# Patient Record
Sex: Female | Born: 1960 | Race: Black or African American | Hispanic: No | Marital: Married | Smoking: Former smoker
Health system: Southern US, Community
[De-identification: ages and names within clinical notes are randomized; demographics above are authoritative.]

## PROBLEM LIST (undated history)

## (undated) DIAGNOSIS — I10 Essential (primary) hypertension: Secondary | ICD-10-CM

## (undated) DIAGNOSIS — K589 Irritable bowel syndrome without diarrhea: Secondary | ICD-10-CM

## (undated) HISTORY — DX: Irritable bowel syndrome, unspecified: K58.9

---

## 1991-12-29 HISTORY — PX: TUBAL LIGATION: SHX77

## 1993-12-28 HISTORY — PX: ABDOMINAL HYSTERECTOMY: SHX81

## 1998-06-17 ENCOUNTER — Ambulatory Visit (HOSPITAL_COMMUNITY): Admission: RE | Admit: 1998-06-17 | Discharge: 1998-06-17 | Payer: Self-pay

## 1999-08-29 HISTORY — PX: LUMBAR DISC SURGERY: SHX700

## 2000-04-05 ENCOUNTER — Encounter: Payer: Self-pay | Admitting: Internal Medicine

## 2000-04-05 ENCOUNTER — Emergency Department (HOSPITAL_COMMUNITY): Admission: EM | Admit: 2000-04-05 | Discharge: 2000-04-05 | Payer: Self-pay | Admitting: Emergency Medicine

## 2000-04-05 ENCOUNTER — Observation Stay (HOSPITAL_COMMUNITY): Admission: AD | Admit: 2000-04-05 | Discharge: 2000-04-07 | Payer: Self-pay | Admitting: Internal Medicine

## 2000-04-05 ENCOUNTER — Encounter: Payer: Self-pay | Admitting: Emergency Medicine

## 2000-05-20 ENCOUNTER — Ambulatory Visit (HOSPITAL_COMMUNITY): Admission: RE | Admit: 2000-05-20 | Discharge: 2000-05-20 | Payer: Self-pay | Admitting: Psychiatry

## 2001-09-13 ENCOUNTER — Emergency Department (HOSPITAL_COMMUNITY): Admission: EM | Admit: 2001-09-13 | Discharge: 2001-09-13 | Payer: Self-pay

## 2002-03-10 ENCOUNTER — Encounter: Admission: RE | Admit: 2002-03-10 | Discharge: 2002-03-10 | Payer: Self-pay | Admitting: Family Medicine

## 2002-03-10 ENCOUNTER — Encounter: Payer: Self-pay | Admitting: Family Medicine

## 2002-08-31 ENCOUNTER — Inpatient Hospital Stay (HOSPITAL_COMMUNITY): Admission: RE | Admit: 2002-08-31 | Discharge: 2002-09-01 | Payer: Self-pay | Admitting: Neurosurgery

## 2002-08-31 ENCOUNTER — Encounter: Payer: Self-pay | Admitting: Neurosurgery

## 2005-07-29 ENCOUNTER — Encounter: Admission: RE | Admit: 2005-07-29 | Discharge: 2005-07-29 | Payer: Self-pay | Admitting: Gastroenterology

## 2006-07-15 ENCOUNTER — Encounter: Admission: RE | Admit: 2006-07-15 | Discharge: 2006-07-15 | Payer: Self-pay | Admitting: Internal Medicine

## 2006-07-19 ENCOUNTER — Encounter: Admission: RE | Admit: 2006-07-19 | Discharge: 2006-07-19 | Payer: Self-pay | Admitting: Internal Medicine

## 2006-12-15 ENCOUNTER — Encounter: Admission: RE | Admit: 2006-12-15 | Discharge: 2006-12-15 | Payer: Self-pay | Admitting: Internal Medicine

## 2007-11-04 ENCOUNTER — Encounter (INDEPENDENT_AMBULATORY_CARE_PROVIDER_SITE_OTHER): Payer: Self-pay | Admitting: Internal Medicine

## 2007-11-04 ENCOUNTER — Inpatient Hospital Stay (HOSPITAL_COMMUNITY): Admission: EM | Admit: 2007-11-04 | Discharge: 2007-11-07 | Payer: Self-pay | Admitting: Emergency Medicine

## 2007-11-04 ENCOUNTER — Ambulatory Visit: Payer: Self-pay | Admitting: Vascular Surgery

## 2007-11-15 ENCOUNTER — Encounter: Admission: RE | Admit: 2007-11-15 | Discharge: 2007-11-15 | Payer: Self-pay | Admitting: Internal Medicine

## 2007-11-22 ENCOUNTER — Encounter: Admission: RE | Admit: 2007-11-22 | Discharge: 2007-11-22 | Payer: Self-pay | Admitting: Gastroenterology

## 2007-11-29 ENCOUNTER — Encounter: Admission: RE | Admit: 2007-11-29 | Discharge: 2007-11-29 | Payer: Self-pay | Admitting: Internal Medicine

## 2007-12-02 ENCOUNTER — Encounter: Admission: RE | Admit: 2007-12-02 | Discharge: 2007-12-02 | Payer: Self-pay | Admitting: Internal Medicine

## 2008-12-04 ENCOUNTER — Encounter: Admission: RE | Admit: 2008-12-04 | Discharge: 2008-12-04 | Payer: Self-pay | Admitting: Gastroenterology

## 2008-12-12 ENCOUNTER — Encounter: Admission: RE | Admit: 2008-12-12 | Discharge: 2008-12-12 | Payer: Self-pay | Admitting: Gastroenterology

## 2009-04-18 ENCOUNTER — Encounter: Admission: RE | Admit: 2009-04-18 | Discharge: 2009-04-18 | Payer: Self-pay | Admitting: Family Medicine

## 2009-12-05 ENCOUNTER — Encounter: Admission: RE | Admit: 2009-12-05 | Discharge: 2009-12-05 | Payer: Self-pay | Admitting: Family Medicine

## 2010-12-08 ENCOUNTER — Encounter
Admission: RE | Admit: 2010-12-08 | Discharge: 2010-12-08 | Payer: Self-pay | Source: Home / Self Care | Attending: Family Medicine | Admitting: Family Medicine

## 2011-01-18 ENCOUNTER — Encounter: Payer: Self-pay | Admitting: Internal Medicine

## 2011-01-18 ENCOUNTER — Encounter: Payer: Self-pay | Admitting: Gastroenterology

## 2011-05-12 NOTE — H&P (Signed)
Jane Buckley, Jane Buckley            ACCOUNT NO.:  [id number]   MEDICAL RECORD NO.:  [id number]          PATIENT TYPE:  INP   LOCATION:  4733                         FACILITY:  [facility]   PHYSICIAN:  Kela Millin, M.D.DATE OF BIRTH:  1960-12-29   DATE OF ADMISSION:  11/04/2007  DATE OF DISCHARGE:                              HISTORY & PHYSICAL   CHIEF COMPLAINT:  Chest pain and left-sided paresthesias.   HISTORY OF PRESENT ILLNESS:  The patient is a 50 year old black female  with past medical history significant for GERD, status post surgery for  C4-C5 cervical disk herniation with radiculopathy, mitral valve  prolapse, history of gastric ulcers who presents with the above  complaints.  She states that she has had these symptoms for one day.  Ms. Jane Buckley works from home and reports that while she was typing, she  suddenly could not feel her left hand and she thought that maybe it just  fell asleep and so tried to massage it and just do some movements but  that persisted.  Shortly thereafter she also began having numbness in  her left leg and tried to do the same thing to work it out.  She also  then got up and went outside just to try to move around and relax, but  then began having some chest pain so she came back in and sat down and  then dosed off.  When she woke up, she was still having the same chest  pain and intermittently through the night she continued to have chest  pain.  She describes the pain as sharp, associated with palpitations, 7-  1/2 out of 10 in intensity, mid sternal in location.  She admits to  diaphoresis and states that the duration of the pain was about 10  minutes each time.  She denies radiation.  The patient also reports that  on the morning of admission when she got up, she began having a severe  headache and almost  felt like she would pass out and so she got out of  the shower and proceeded to get ready to come to the ER.  She denies  vertigo, slurred  speech, focal weakness, dysphagia, blurred vision.  No  cough, fevers, dysuria, melena, and no diarrhea.   The patient was seen in the ER and a CT scan of her brain was negative  for acute abnormalities.  An EKG showed normal sinus rhythm with  occasional PA-C.  Her point-of-care markers were negative and she is  admitted for further evaluation and management.  The patient states that  she got very scared because her aunt had a stroke and also her friend  died 2 weeks ago of an aneurysm and was 18 years old.   PAST MEDICAL HISTORY:  1. As above.  2. Irritable bowel syndrome.  3. Status post foot surgery.  4. Status post abdominal hysterectomy secondary to cysts.   MEDICATIONS:  1. Aspirin.  2. A pill for irritable bowel syndrome.  3. Nexium p.r.n. but states that she has not taken any for 3 weeks.   ALLERGIES:  SULFA.  SOCIAL HISTORY:  She denies tobacco.  She drinks wine about two times a  week.   FAMILY HISTORY:  Her aunt had a stroke.  Mother had hypertension.  Father diabetes.  Both grandparents had diabetes and her mom died at age  32 of a heart attack.   REVIEW OF SYSTEMS:  As per HPI, other review of systems negative.   PHYSICAL EXAMINATION:  GENERAL:  The patient is a middle-aged black  female.  She has a flat affect and is teary-eyed intermittently, in no  apparent distress.  HEENT:  PERRL.  EOMI.  Sclerae anicteric, no facial asymmetry, no oral  exudates.  NECK:  Supple, no adenopathy, no thyromegaly and no JVD.  LUNGS:  Clear to auscultation bilaterally.  No crackles or wheezes.  CARDIOVASCULAR:  Regular rate and rhythm.  Normal S1 and S2.  ABDOMEN:  Soft. Bowel sounds present.  Nontender and nondistended.  No  organomegaly and no masses palpable.  EXTREMITIES:  No cyanosis and no edema.  NEUROLOGIC:  She is alert and oriented x3.  Cranial nerves II-XII  grossly intact.  Strength is 4-5 out of 6 and symmetric.  DTRs  symmetric.  Sensory grossly intact.  Tongue  midline.  Nonfocal exam.   LABORATORY DATA:  CT scan and EKG as per HPI.  Her sodium is 139,  potassium 3.6 with a chloride of 107, BUN of 11, glucose 93, pH of 7.40,  pCO2 39.6.  Her white cell count is 3.2 with a hemoglobin of 12.7,  hematocrit 37.2, platelet count 179,000, neutrophil count 47%.  Point-of-  care markers negative x2.   ASSESSMENT/PLAN:  Problem #1.  Chest pain. Will obtain serial cardiac enzymes, place on  aspirin and nitroglycerin as noted above.  She has a history of  gastroesophageal reflux disease and has not taken her proton pump  inhibitor in 3 weeks.  Will resume on the proton pump inhibitor.  Follow  and consider cardiology consultation pending cardiac enzymes.  It is  noted that the patient presented similarly during her last admission two  years ago and was ruled out for myocardial infarction.   Problem #2.  Left-sided paresthesias, also with headaches.  A CT scan of  head negative as above.  Will obtain MRI/MRA in a.m., place on aspirin.  Follow and consider further evaluation pending the studies.  The patient  also complaining of left leg pain.  Will obtain lower extremity Doppler  ultrasound and follow.   Problem #3.  Gastroesophageal reflux disease.  Will place on proton pump  inhibitor.   Problem #4.  History of mitral valve prolapse.      Kela Millin, M.D.  Electronically Signed     ACV/MEDQ  D:  11/04/2007  T:  11/06/2007  Job:  [id number]

## 2011-05-15 NOTE — Op Note (Signed)
NAME:  Jane Buckley, Jane Buckley                      ACCOUNT NO.:  [id number]   MEDICAL RECORD NO.:  [id number]                   PATIENT TYPE:  INP   LOCATION:  3172                                 FACILITY:  [facility]   PHYSICIAN:  Hewitt Shorts, M.D.            DATE OF BIRTH:  [birth year]   DATE OF PROCEDURE:  08/31/2002  DATE OF DISCHARGE:                                 OPERATIVE REPORT   PREOPERATIVE DIAGNOSIS:  C4-5 cervical disk herniation, degenerative disk  disease, spondylosis, and radiculopathy.   POSTOPERATIVE DIAGNOSIS:  C4-5 cervical disk herniation, degenerative disk  disease, spondylosis, and radiculopathy.   PROCEDURE:  C4-5 anterior cervical diskectomy and arthrodesis with iliac  crest allograft and Tether cervical plating.   SURGEON:  Hewitt Shorts, M.D.   ASSISTANT:  Stefani Dama, M.D.   ANESTHESIA:  General endotracheal.   INDICATIONS:  The patient is a 50 year old female with a left cervical  radiculopathy who was found by MRI scan to have left C4-5 cervical disk  herniation superimposed upon underlying degenerative disk disease and  spondylosis.  A decision was made to proceed with a single-level anterior  cervical diskectomy and arthrodesis.   DESCRIPTION OF PROCEDURE:  The patient was brought to the operating room and  placed under general endotracheal anesthesia.  The patient was placed in 10  pounds of Holter traction, and the neck was prepped with Betadine soap and  solution and draped in a sterile fashion.  A horizontal incision was made in  the left side of the neck.  The line of the incision was infiltrated with  local anesthetic with epinephrine.  The incision was made with a Shaw  scalpel at a temperature of 120.  Dissection was carried down through the  subcutaneous tissue and platysma.  Dissection was then carried out through  an avascular plane leaving the sternocleidomastoid, carotid artery, and  jugular vein laterally and the trachea  and esophagus medially.  The ventral  aspect of the vertebral column was identified and a localizing x-ray taken,  and the C4-5 intervertebral disk space was identified.  Diskectomy was begun  with incision of the annulus and continued with microcurettes and pituitary  rongeurs.  The cartilaginous end plates of the corresponding vertebrae were  removed using microcurettes and the Micro-Max drill.  Anterior osteophytic  overgrowth was removed using the osteophyte removal tool.  The microscope  was draped and brought into the field to provide additional magnification,  illumination, and visualization, and the remainder of the procedure was  performed using microdissection and microsurgical technique.  Posterior  osteophytic overgrowth was removed using the Micro-Max drill, and the  posterior longitudinal ligament was removed as well with a 2 mm Kerrison  punch with a thin foot plate.  As this was performed, we encountered the  disk fragment to the left side.  This was removed, and the thecal sac and  nerve root were decompressed.  The  foramina were examined, and the nerve  roots were felt to be decompressed.  The thecal sac was decompressed within  the spinal canal.  Once the decompression was completed, hemostasis was  established with the use of Gelfoam soaked in thrombin and then we selected  a wedge of iliac crest allograft.  It was cut and shaped to size and  positioned in the intervertebral disk space and counter sunk.  We then  selected a 14 mm Tether cervical plate.  It was positioned over the fusion  construct and secured to each of the vertebrae with a pair of 4.0 x 13 mm  screws.  Each screw hole was drilled and tapped and the screws were placed  in an alternating fashion.  Final tightening of the screws was performed,  and then the wound was irrigated with bacitracin solution and checked for  hemostasis, which was established and confirmed.  An x-ray was taken that  showed the  graft, plate, and screws to be in good position, and then we  proceeded with closure.  The platysma was closed with interrupted, inverted  2-0 undyed Vicryl sutures, the subcutaneous and subcuticular were closed  with interrupted, inverted 3-0 undyed Vicryl sutures, and the skin was  reapproximated with Dermabond.  The patient tolerated the procedure well.  The estimated blood loss was 50 cc.  Sponge and needle count were correct.  Following surgery, the patient had cervical traction discontinued, the soft  collar placed, and the patient was reversed from the anesthetic, extubated,  and was transferred to the recovery room for further care, where she was  noted to be moving all four extremities.                                               Hewitt Shorts, M.D.    RWN/MEDQ  D:  08/31/2002  T:  09/01/2002  Job:  [id number]

## 2011-05-15 NOTE — Discharge Summary (Signed)
NAMELOA, IDLER NO.:  [id number]   MEDICAL RECORD NO.:  [id number]          PATIENT TYPE:  INP   LOCATION:  4733                         FACILITY:  [facility]   PHYSICIAN:  Deirdre Peer. Polite, M.D. DATE OF BIRTH:  1961-03-01   DATE OF ADMISSION:  11/04/2007  DATE OF DISCHARGE:  11/07/2007                               DISCHARGE SUMMARY   DISCHARGE DIAGNOSES:  1. Chest pain, atypical for cardiac.  2. Enzymes negative for ischemia.  3. Left paresthesia, MRI/MRA negative for acute change, borderline      criteria for  Chiari malformation for outpatient followup with neurology.  1. Gastroesophageal reflux disease.  2. History of C4-5 disk herniations status post surgery.  3. History of mitral valve prolapse.  4. History of irritable bowel syndrome.   DISCHARGE MEDICATIONS:  Vicodin 2.6 p.r.n.   DISPOSITION:  Discharged to home in stable condition.  Outpatient  followup by primary MD.   STUDIES:  MRI/MRA borderline criteria for Chiari malformation.  Cardiac  enzymes negative for ischemia.  Monitor negative for arrhythmia.  CT of  the head negative for any acute abnormalities.   HISTORY OF PRESENT ILLNESS:  This 50 year old female presented to the  hospital with chest pain and left-sided paresthesias.  Admission was  deemed necessary for preop evaluation and treatment.  Please see the  dictated H&P for further details.   PAST MEDICAL HISTORY:  Prior admission H&P.   MEDICATIONS ON ADMISSION:  1. Aspirin p.r.n.  2. Nexium.   SOCIAL HISTORY:  As prior admission H&P.   HOSPITAL COURSE:  The patient was admitted to a medical floor bed for  evaluation and treatment of atypical chest pain and left-sided  paresthesias.  The patient had multiple studies as stated above.  MRI/MRA negative for any  acute abnormality,__________ borderline criteria for Chiari  malformation.  Cardiac enzymes negative for ischemia.  EKG within normal  limits.  No arrhythmia on  monitor.  The patient's hospital course was  relatively unremarkable.  She was discharged in stable condition for  further outpatient followup of her left-sided paresthesias.      Deirdre Peer. Polite, M.D.  Electronically Signed     RDP/MEDQ  D:  12/15/2007  T:  12/15/2007  Job:  [id number]

## 2011-05-15 NOTE — H&P (Signed)
[place]. [facility]  Patient:    Jane Buckley, Jane Buckley                   MRN: [id number] Adm. Date:  [id number] Attending:  Doug Sou CC:         Corwin Levins, M.D. [facility]                         History and Physical  24 HOUR OBSERVATION:  CHIEF COMPLAINT:  Severe pleuritic chest pain, right leg pain, and shortness of  breath.  BRIEF HISTORY:  The patient is a 50 year old female who presents with worsening  symptoms with the above periodically since Friday.  Last night around 1 oclock he went to [facility] emergency room where she was evaluated by Dr. Ethelda Chick. Apparently she had blood work, chest x-ray, CAT scan of the chest and right leg. She was told that she does not have a life-threatening condition and discharged  home on Motrin.  Since she arrived home she had 3 more episodes which were severe and prompted her to come to the office.  She does not recall any triggering events, did not lift anything heavy or eat something unusual.  There is no nausea and vomiting, chills, fever, or cough.  No syncope.  No palpitations.  PAST MEDICAL HISTORY:  History of stomach ulcers, partial hysterectomy, irritable bowel syndrome, mitral valve prolapse.  FAMILY HISTORY:  Mother had a heart condition, diabetes, and gallstones.  One aunt with gallstones.  Father died with lung cancer and esophageal cancer.  SOCIAL HISTORY:  She has 2 children, one son with asthma.  She is divorced. She works for [employer].  She is a nonsmoker, nondrinker.  MEDICATIONS:  None at present.  ALLERGIES:  Sulfa.  REVIEW OF SYSTEMS:  As above.  The rest is negative.  PHYSICAL EXAMINATION:  VITAL SIGNS:  Blood pressure 107/72, pulse 72, temperature 98.0, weight 134 pounds, she appears tired.  She is more comfortable sitting up then supine.  HEENT: Moist mucosa.  NECK:  Supple.  No thyromegaly or bruit.  LUNGS:  Clear to auscultation and percussion.  No  wheezes or rales.  HEART:  S1, S2.  No gallop.  No rub or murmur.  There is no pulsating masses in the abdomen.  Peripheral pulses in all four extremities is symmetric and normal.  ABDOMEN:  Soft, slightly tender in the epigastric area.  No organomegaly, no mass felt.  Lower extremity without edema.  Right calf was slightly tender to palpation. No swelling or discoloration.  NEUROLOGIC:  Alert, oriented, cooperative and slightly anxious.  LABORATORY DATA:  EKG with normal sinus rhythm.  Pulse oximetry 98% on room air.  ASSESSMENT AND PLAN: 1. Severe recurrent chest pain unclear etiology.  Potential etiology includes    stomach ulcers, esophageal spasms, esophagitis, pericarditis, costochondritis    and others.  Need to obtain results of the Cat scan.  Obtain GI consult for    possible endoscopy.  Start empirically on protonix and vioxx. 2. Right leg pain.  Plan as above.  May need further studies. 3. Dyspnea.  Treat with oxygen as needed. 4. History of stomach ulcers.  Start with protonix as above. DD:  04/05/00 TD:  04/05/00 Job: [id number] KG/MW102

## 2011-05-15 NOTE — Procedures (Signed)
[place]. [facility]  Patient:    Jane Buckley, Jane Buckley                   MRN: [id number] Proc. Date: 04/06/00 Adm. Date:  [id number] Disc. Date: [id number] Attending:  Doug Sou CC:         Venita Lick. Pleas Koch., M.D. [facility]             Sonda Primes, M.D. [facility]                           Procedure Report  PROCEDURE PERFORMED:  Esophagogastroduodenoscopy.  ENDOSCOPIST:  Venita Lick. Pleas Koch., M.D. [facility]  REFERRING PHYSICIAN:  Sonda Primes, M.D. [facility]  INDICATIONS FOR PROCEDURE:  The patient is a 50 year old African-American female with acute epigastric pain, chest pain and right thigh pain.  The etiology of her symptoms remains unclear.  Rule out gastroesophageal reflux disease, ulcer disease and esophagitis.  PHYSICAL EXAMINATION:  Chest:  Clear to auscultation.  Cardiac:  Regular rate and rhythm without murmurs.  Neurologic:  Alert oriented x 3.  ANESTHESIA:  Fentanyl 50 mcg IV, Versed 5 mg IV and pharyngeal Cetacaine spray.  MONITORING:  Automated blood pressure monitor, pulse oximeter and cardiac monitor.  Low flow oxygen was given by nasal cannula throughout the procedure. The procedure was well tolerated with no immediate complications.  DESCRIPTION OF PROCEDURE:  After the nature of the procedure was discussed with the patient including discussion of its risks, benefits and alternatives, the patient consented to proceed.  She was then comfortably sedated in the left lateral decubitus position.  The Olympus video endoscope was inserted in the posterior pharynx and the esophagus was intubated under direct visualization.  The esophagus had an entirely normal appearance.  The Z-line measured at 39 cm from the incisions.  Examination of the gastric body revealed no abnormalities.  Retroflex view of the angularis, lesser curvature, cardia and fundus revealed no abnormalities.  The gastric antrum, gastric pylorus, duodenal bulb and descending duodenum  all appeared entirely normal. Video photographs were obtained.  The stomach was decompressed and the endoscope was withdrawn from the patient.  IMPRESSION: 1. Normal upper endoscopy. 2. Continue Protonix at 40 mg q.d. but I do not feel her symptoms are likely    related to gastroesophageal reflux disease.  Proceed with abdominal    ultrasound imaging.  If this is negative, will need to evaluate for nongastrointestinal etiologies of her symptoms. DD:  04/06/00 TD:  04/07/00 Job: [id number] LKG/MW102

## 2011-05-15 NOTE — Discharge Summary (Signed)
[place]. [facility]  Patient:    Jane Buckley, Jane Buckley                   MRN: [id number] Adm. Date:  [id number] Disc. Date: [id number] Attending:  Tresa Garter Dictator:   Cornell Barman, P.A. CC:         Corwin Levins, M.D. [facility]                           Discharge Summary  DISCHARGE DIAGNOSES: 1. Atypical chest pain. 2. Right lower extremity pain. 3. Dyspnea.  BRIEF ADMISSION HISTORY:  [name] is a 50 year old African-American female who presents with pleuritic chest pain, right leg pain and shortness of breath.  The patient describes the onset of her symptoms Friday evening.  On April 04, 2000 she went to the [facility] Emergency Room where she was evaluated by Dr. Ethelda Chick.  At that time she had unremarkable blood work, chest x-ray, and CT scans of the chest and right leg.  She was instructed that she did not have a life-threatening condition and discharged the patient on Motrin.  Since her discharge home from the emergency room she has had three episodes of severe dyspnea and right leg pain.  This prompted her office visit.  PAST MEDICAL HISTORY: 1. Status post abdominal hysterectomy secondary to cysts. 2. Status post foot surgery. 3. Diagnosis of irritable bowel syndrome. 4. History of stomach ulcers. 5. Mitral valve prolapse.  LABORATORY DATA: On admission, sed rate 4.  CK 69.  MB was less than 0.3.  Hemoglobin 13, hematocrit 39.  CMET was normal.  Abdominal ultrasound showed no focal abnormality.  HOSPITAL COURSE: #1 - ATYPICAL CHEST PAIN:  The patient was admitted for 24-hour observation. Cardiac enzymes were obtained and ruled out myocardial infarction.  EKG  was without ischemia.  GI consult was obtained, Dr. Russella Dar saw the patient and, although her symptoms were not cleared, he recommended proceeding with endoscopy.  Endoscopy was performed April 06, 2000 and was normal. Dr. Russella Dar recommended continuing Protonix b.i.d.,  but did no feel her symptoms were not related to gastroesophageal reflux disease.  #2 - As noted, the patient did have a CT of the chest on April 05, 2000. This showed no evidence of pulmonary emboli.  The patient also had a CT scan of her right lower extremity, which showed no DVT.  The patient continued to complain of pain in the right lower extremity and prior to discharge we did performed lower extremity venous Dopplers, which were negative for thrombosis.  The patient was empirically started on Vioxx during this hospitalization as well. Although the patient continued to complain of dyspnea her saturations were greater than 98% on room air and there was no evidence of an acute pulmonary process.  At this time the patient is medically stable for discharge.  FOLLOW-UP:  The patient was instructed to follow up with Dr. Jonny Ruiz as an outpatient for persistent symptoms.  The patient is to follow up with Dr. Jonny Ruiz in seven to 10 days.  MEDICATIONS AT DISCHARGE:  Protonix 40 mg b.i.d. times one month and Vioxx 25 mg q day for seven days. DD:  04/07/00 TD:  04/07/00 Job: [id number] OZ/[id number]

## 2011-07-20 ENCOUNTER — Inpatient Hospital Stay (INDEPENDENT_AMBULATORY_CARE_PROVIDER_SITE_OTHER)
Admission: RE | Admit: 2011-07-20 | Discharge: 2011-07-20 | Disposition: A | Discharge status: Home / Self Care | Payer: 59 | Source: Ambulatory Visit | Attending: Family Medicine | Admitting: Family Medicine

## 2011-07-20 ENCOUNTER — Ambulatory Visit (HOSPITAL_COMMUNITY)
Admission: RE | Admit: 2011-07-20 | Discharge: 2011-07-20 | Disposition: A | Discharge status: Home / Self Care | Payer: 59 | Source: Ambulatory Visit | Attending: Family Medicine | Admitting: Family Medicine

## 2011-07-20 DIAGNOSIS — M7989 Other specified soft tissue disorders: Secondary | ICD-10-CM

## 2011-07-20 DIAGNOSIS — F411 Generalized anxiety disorder: Secondary | ICD-10-CM

## 2011-07-20 DIAGNOSIS — M79609 Pain in unspecified limb: Secondary | ICD-10-CM | POA: Insufficient documentation

## 2011-10-06 LAB — I-STAT 8, (EC8 V) (CONVERTED LAB)
Bicarbonate: 24.7 — ABNORMAL HIGH
Chloride: 107
Operator id: 270111
Potassium: 3.6
TCO2: 26
pCO2, Ven: 39.6 — ABNORMAL LOW

## 2011-10-06 LAB — D-DIMER, QUANTITATIVE: D-Dimer, Quant: 0.3

## 2011-10-06 LAB — POCT CARDIAC MARKERS
CKMB, poc: <1 — ABNORMAL LOW
Myoglobin, poc: 60.3
Troponin i, poc: <0.05

## 2011-10-06 LAB — CBC
Hemoglobin: 12.7
MCHC: 34.1
RBC: 3.91
WBC: 3.2 — ABNORMAL LOW

## 2011-10-06 LAB — DIFFERENTIAL
Basophils Relative: 1
Lymphocytes Relative: 44
Lymphs Abs: 1.4
Monocytes Absolute: 0.2
Monocytes Relative: 7
Neutro Abs: 1.5 — ABNORMAL LOW
Neutrophils Relative %: 47

## 2011-10-06 LAB — CARDIAC PANEL(CRET KIN+CKTOT+MB+TROPI)
CK, MB: 0.5
Relative Index: INVALID
Total CK: 71
Troponin I: 0.02

## 2011-10-06 LAB — LIPID PANEL
Cholesterol: 150
LDL Cholesterol: 81
Total CHOL/HDL Ratio: 2.6

## 2011-10-06 LAB — CK TOTAL AND CKMB (NOT AT ARMC)
CK, MB: 0.7
Relative Index: INVALID
Total CK: 90

## 2011-10-06 LAB — POCT I-STAT CREATININE: Creatinine, Ser: 0.8

## 2011-11-10 ENCOUNTER — Other Ambulatory Visit: Payer: Self-pay | Admitting: Family Medicine

## 2011-11-10 DIAGNOSIS — Z1231 Encounter for screening mammogram for malignant neoplasm of breast: Secondary | ICD-10-CM

## 2011-12-10 ENCOUNTER — Ambulatory Visit: Payer: 59

## 2011-12-31 ENCOUNTER — Ambulatory Visit
Admission: RE | Admit: 2011-12-31 | Discharge: 2011-12-31 | Disposition: A | Discharge status: Home / Self Care | Payer: 59 | Source: Ambulatory Visit | Attending: Family Medicine | Admitting: Family Medicine

## 2011-12-31 DIAGNOSIS — Z1231 Encounter for screening mammogram for malignant neoplasm of breast: Secondary | ICD-10-CM

## 2012-11-29 ENCOUNTER — Other Ambulatory Visit: Payer: Self-pay | Admitting: Family Medicine

## 2012-11-29 DIAGNOSIS — Z1231 Encounter for screening mammogram for malignant neoplasm of breast: Secondary | ICD-10-CM

## 2013-01-09 ENCOUNTER — Ambulatory Visit: Payer: 59

## 2013-01-13 ENCOUNTER — Ambulatory Visit: Payer: 59

## 2013-02-07 ENCOUNTER — Ambulatory Visit: Payer: 59 | Admitting: Obstetrics and Gynecology

## 2013-02-07 ENCOUNTER — Encounter: Payer: Self-pay | Admitting: Obstetrics and Gynecology

## 2013-02-07 VITALS — BP 130/72 | HR 70 | Resp 16 | Ht 65.0 in | Wt 128.0 lb

## 2013-02-07 DIAGNOSIS — Z90721 Acquired absence of ovaries, unilateral: Secondary | ICD-10-CM | POA: Insufficient documentation

## 2013-02-07 DIAGNOSIS — Z01419 Encounter for gynecological examination (general) (routine) without abnormal findings: Secondary | ICD-10-CM

## 2013-02-07 DIAGNOSIS — Z90710 Acquired absence of both cervix and uterus: Secondary | ICD-10-CM

## 2013-02-07 DIAGNOSIS — Z005 Encounter for examination of potential donor of organ and tissue: Secondary | ICD-10-CM

## 2013-02-07 NOTE — Progress Notes (Signed)
AEX  Last GEX:BMWUX to hysterectomy WNL: Yes Regular Periods:Hysterectomy Contraception: Hysterectomy  Monthly Breast exam:yes Tetanus<68yrs:no Nl.Bladder Function:yes Daily BMs:yes Healthy Diet:yes Calcium:yes Mammogram:yes Date of Mammogram: 1/20123 WNL Exercise:yes Have often Exercise: 4 x wk Seatbelt: yes Abuse at home: no Stressful work::yes Sigmoid-colonoscopy: None Bone Density: No PCP: Juluis Rainier Change in PMH: None Change in LKG:MWNU  Subjective:    Jane Buckley is a 51 y.o. female No obstetric history on file. who presents for annual exam. She will donate a kidney to her pastor and a pap is required. The patient has no complaints today.   The following portions of the patient's history were reviewed and updated as appropriate: allergies, current medications, past family history, past medical history, past social history, past surgical history and problem list.  Review of Systems Pertinent items are noted in HPI. Gastrointestinal:No change in bowel habits, no abdominal pain, no rectal bleeding Genitourinary:negative for dysuria, frequency, hematuria, nocturia and urinary incontinence    Objective:     There were no vitals taken for this visit.  Weight:  Wt Readings from Last 1 Encounters:  No data found for Wt     BMI: There is no height or weight on file to calculate BMI. General Appearance: Alert, appropriate appearance for age. No acute distress HEENT: Grossly normal Neck / Thyroid: Supple, no masses, nodes or enlargement Lungs: clear to auscultation bilaterally Back: No CVA tenderness Breast Exam: No masses or nodes.No dimpling, nipple retraction or discharge. Cardiovascular: Regular rate and rhythm. S1, S2, no murmur Gastrointestinal: Soft, non-tender, no masses or organomegaly Pelvic Exam: Vulva and vagina appear normal. Bimanual exam reveals normal  Adnexa, well suspended vaginal vault and surgically absent uterus. Rectovaginal: normal  rectal, no masses Lymphatic Exam: Non-palpable nodes in neck, clavicular, axillary, or inguinal regions Skin: no rash or abnormalities Neurologic: Normal gait and speech, no tremor  Psychiatric: Alert and oriented, appropriate affect.    Urinalysis:Not done    Assessment:    Normal gyn exam  s/ p hysterectomy   Plan:   mammogram pap smear no further paps needed for cervical cancer screening return annually or prn   Dierdre Forth MD

## 2013-02-09 LAB — PAP IG W/ RFLX HPV ASCU

## 2013-02-13 ENCOUNTER — Telehealth: Payer: Self-pay

## 2013-02-13 ENCOUNTER — Telehealth: Payer: Self-pay | Admitting: Obstetrics and Gynecology

## 2013-02-13 NOTE — Telephone Encounter (Signed)
Pt made aware of negative pap results. Pt requested copy be mailed to her home address as well. CW  CMA made aware.  [facility] CMA

## 2013-02-13 NOTE — Telephone Encounter (Signed)
Left message for pt to call the office back regarding results.. Pap has not been reviewed by Dr.Haygood Pt will be made aware.   [facility] CMA

## 2013-02-14 ENCOUNTER — Telehealth: Payer: Self-pay | Admitting: Obstetrics and Gynecology

## 2013-02-20 ENCOUNTER — Ambulatory Visit
Admission: RE | Admit: 2013-02-20 | Discharge: 2013-02-20 | Disposition: A | Discharge status: Home / Self Care | Payer: 59 | Source: Ambulatory Visit | Attending: Family Medicine | Admitting: Family Medicine

## 2013-02-20 DIAGNOSIS — Z1231 Encounter for screening mammogram for malignant neoplasm of breast: Secondary | ICD-10-CM

## 2013-02-25 HISTORY — PX: KIDNEY DONATION: SHX685

## 2013-03-06 ENCOUNTER — Ambulatory Visit: Admission: RE | Admit: 2013-03-06 | Payer: 59 | Source: Ambulatory Visit

## 2013-03-06 ENCOUNTER — Other Ambulatory Visit: Payer: Self-pay | Admitting: Family Medicine

## 2013-03-06 ENCOUNTER — Ambulatory Visit
Admission: RE | Admit: 2013-03-06 | Discharge: 2013-03-06 | Disposition: A | Discharge status: Home / Self Care | Payer: 59 | Source: Ambulatory Visit | Attending: Family Medicine | Admitting: Family Medicine

## 2013-03-06 DIAGNOSIS — Z139 Encounter for screening, unspecified: Secondary | ICD-10-CM

## 2013-05-05 ENCOUNTER — Emergency Department (INDEPENDENT_AMBULATORY_CARE_PROVIDER_SITE_OTHER)
Admission: EM | Admit: 2013-05-05 | Discharge: 2013-05-05 | Disposition: A | Discharge status: Home / Self Care | Payer: 59 | Source: Home / Self Care

## 2013-05-05 ENCOUNTER — Encounter (HOSPITAL_COMMUNITY): Payer: Self-pay | Admitting: Emergency Medicine

## 2013-05-05 ENCOUNTER — Other Ambulatory Visit: Payer: Self-pay

## 2013-05-05 DIAGNOSIS — E860 Dehydration: Secondary | ICD-10-CM

## 2013-05-05 DIAGNOSIS — M791 Myalgia, unspecified site: Secondary | ICD-10-CM

## 2013-05-05 DIAGNOSIS — IMO0001 Reserved for inherently not codable concepts without codable children: Secondary | ICD-10-CM

## 2013-05-05 DIAGNOSIS — A084 Viral intestinal infection, unspecified: Secondary | ICD-10-CM

## 2013-05-05 DIAGNOSIS — R5383 Other fatigue: Secondary | ICD-10-CM

## 2013-05-05 DIAGNOSIS — R5381 Other malaise: Secondary | ICD-10-CM

## 2013-05-05 DIAGNOSIS — R112 Nausea with vomiting, unspecified: Secondary | ICD-10-CM

## 2013-05-05 DIAGNOSIS — A088 Other specified intestinal infections: Secondary | ICD-10-CM

## 2013-05-05 LAB — POCT I-STAT, CHEM 8
Chloride: 99 mEq/L (ref 96–112)
Glucose, Bld: 89 mg/dL (ref 70–99)
HCT: 39 % (ref 36.0–46.0)
Hemoglobin: 13.3 g/dL (ref 12.0–15.0)
Potassium: 3.7 mEq/L (ref 3.5–5.1)

## 2013-05-05 MED ORDER — SODIUM CHLORIDE 0.9 % IV SOLN
Freq: Once | INTRAVENOUS | Status: AC
  Administered 2013-05-05: 17:00:00 via INTRAVENOUS

## 2013-05-05 MED ORDER — ONDANSETRON HCL 4 MG/2ML IJ SOLN
4.0000 mg | Freq: Once | INTRAMUSCULAR | Status: AC
  Administered 2013-05-05: 4 mg via INTRAVENOUS

## 2013-05-05 MED ORDER — ONDANSETRON HCL 4 MG PO TABS: 4.0000 mg | ORAL_TABLET | Freq: Four times a day (QID) | ORAL | Status: DC

## 2013-05-05 MED ORDER — ONDANSETRON HCL 4 MG/2ML IJ SOLN
INTRAMUSCULAR | Status: AC
  Filled 2013-05-05: qty 2

## 2013-05-05 NOTE — ED Provider Notes (Signed)
History     CSN: [id number]  Arrival date & time 05/05/13  1519   First MD Initiated Contact with Patient 05/05/13 1610      Chief Complaint  Patient presents with  . Diarrhea    (Consider location/radiation/quality/duration/timing/severity/associated sxs/prior treatment) HPI Comments: 52 year old female became ill 2 days ago with diarrhea, fever of 103, nausea and vomiting. The first and second day she had vomiting for total  7-8 times and diarrhea over 10 times q d the first 2 days. She has not had vomiting or diarrhea today , feels extremely weak, fatigue and malaise. She also has upper epigastric lower retrosternal pain. She describes the pain is sharp and fleeting.  Also complains of "hard to breathe". No heaviness, tightness or pressure in chest.    Past Medical History  Diagnosis Date  . IBS (irritable bowel syndrome)     Past Surgical History  Procedure Laterality Date  . Hysterectomy    . Disc removed  2007  . Kidney donation      Family History  Problem Relation Age of Onset  . Cancer Father   . Heart disease Mother     History  Substance Use Topics  . Smoking status: Never Smoker   . Smokeless tobacco: Not on file  . Alcohol Use: Yes    OB History   Grav Para Term Preterm Abortions TAB SAB Ect Mult Living                  Review of Systems  Constitutional: Positive for fever, chills, activity change and fatigue.  HENT: Positive for sore throat and postnasal drip. Negative for ear pain and neck stiffness.   Respiratory: Positive for shortness of breath.   Gastrointestinal: Positive for nausea, vomiting, abdominal pain and diarrhea. Negative for blood in stool.  Genitourinary: Negative.   Musculoskeletal: Negative.   Skin: Negative.   Neurological: Negative.     Allergies  Bactrim and Sulfa antibiotics  Home Medications   Current Outpatient Rx  Name  Route  Sig  Dispense  Refill  . aspirin EC 81 MG tablet   Oral   Take 81 mg by mouth  daily.         . Calcium-Magnesium-Zinc 333-133-5 MG TABS   Oral   Take by mouth 2 (two) times daily.         . cholecalciferol (VITAMIN D) 1000 UNITS tablet   Oral   Take 1,000 Units by mouth daily.         . Multiple Vitamin (MULTIVITAMIN) tablet   Oral   Take 1 tablet by mouth daily.         . ondansetron (ZOFRAN) 4 MG tablet   Oral   Take 1 tablet (4 mg total) by mouth every 6 (six) hours.   12 tablet   0     BP 118/74  Pulse 58  Temp(Src) 98.2 F (36.8 C) (Oral)  Resp 18  SpO2 98%  Physical Exam  Nursing note and vitals reviewed. Constitutional: She is oriented to person, place, and time. She appears well-developed and well-nourished. No distress.  Appears moderately ill.  HENT:  Mouth/Throat: No oropharyngeal exudate.  Bilateral TMs are normal. Oropharynx with minor erythema and cobblestoning. No exudates.  Eyes: Conjunctivae and EOM are normal.  Neck: Normal range of motion. Neck supple.  Cardiovascular: Normal rate, regular rhythm and normal heart sounds.   Pulmonary/Chest: Effort normal and breath sounds normal. No respiratory distress. She has no wheezes. She has  no rales.  No apparent respiratory distress, tachypnea, hyperpnea,  Abdominal: Soft. She exhibits no distension and no mass. There is no tenderness. There is no rebound and no guarding.  Abdomen thin and Flat. Aorta easily palpated in pulse seen with heart rate.  Musculoskeletal: She exhibits no edema and no tenderness.  Lymphadenopathy:    She has no cervical adenopathy.  Neurological: She is alert and oriented to person, place, and time. She exhibits normal muscle tone.  Skin: Skin is warm and dry.  Psychiatric: She has a normal mood and affect.    ED Course  Procedures (including critical care time)  Labs Reviewed  POCT I-STAT, CHEM 8 - Abnormal; Notable for the following:    Creatinine, Ser 1.30 (*)    All other components within normal limits   No results found.   Results  for orders placed during the hospital encounter of 05/05/13  POCT I-STAT, CHEM 8      Result Value Range   Sodium 136  135 - 145 mEq/L   Potassium 3.7  3.5 - 5.1 mEq/L   Chloride 99  96 - 112 mEq/L   BUN 9  6 - 23 mg/dL   Creatinine, Ser 7.82 (*) 0.50 - 1.10 mg/dL   Glucose, Bld 89  70 - 99 mg/dL   Calcium, Ion 9.56  2.13 - 1.23 mmol/L   TCO2 26  0 - 100 mmol/L   Hemoglobin 13.3  12.0 - 15.0 g/dL   HCT 08.6  57.8 - 46.9 %      1. Viral gastroenteritis   2. Malaise and fatigue   3. Dehydration   4. Myalgia       MDM  EKG: NSR. No ST-T changes. No ectopy.  The patient received a liter of normal saline and Zofran 4 mg IV. She states that she is feeling a little better and ready to go home.  She will be discharged with a prescription for Zofran 4 mg by mouth q. 6 hours when necessary nausea or vomiting. Obtain Pedialyte and drink that in small frequent amounts for rehydration. Clear liquid diet for the next 24 hours then slowly advance diet as instructed. Any worsening new symptoms or problems such as fevers, return of vomiting and diarrhea, abdominal pain or other problems may return or go to the emergent apartment depending on severity.  Hayden Rasmussen, NP 05/05/13 [phone]

## 2013-05-05 NOTE — ED Notes (Signed)
Script not signed, waited for signature

## 2013-05-05 NOTE — ED Notes (Signed)
Patient reports symptoms started on 5/7.  Reports body spasms, diarrhea, breathing difficulties, fever, chills and fatigue.  Patient reports on Wednesday had 10 episodes of diarrhea, 3-4 episodes of vomiting.  Today has not had any episodes of vomiting or diarrhea

## 2013-05-05 NOTE — ED Notes (Signed)
Offered repositioning of patient, declined

## 2013-05-08 NOTE — ED Provider Notes (Signed)
Medical screening examination/treatment/procedure(s) were performed by non-physician practitioner and as supervising physician I was immediately available for consultation/collaboration.   [facility]; MD  Sharin Grave, MD 05/08/13 1505

## 2013-08-20 ENCOUNTER — Encounter (HOSPITAL_COMMUNITY): Payer: Self-pay | Admitting: *Deleted

## 2013-08-20 ENCOUNTER — Encounter (HOSPITAL_COMMUNITY): Payer: Self-pay

## 2013-08-20 ENCOUNTER — Emergency Department (HOSPITAL_COMMUNITY)
Admission: EM | Admit: 2013-08-20 | Discharge: 2013-08-20 | Disposition: A | Discharge status: Home / Self Care | Payer: 59 | Attending: Emergency Medicine | Admitting: Emergency Medicine

## 2013-08-20 ENCOUNTER — Emergency Department (HOSPITAL_COMMUNITY): Payer: 59

## 2013-08-20 ENCOUNTER — Inpatient Hospital Stay (HOSPITAL_COMMUNITY)
Admission: EM | Admit: 2013-08-20 | Discharge: 2013-08-29 | DRG: 336 | Disposition: A | Discharge status: Home / Self Care | Payer: 59 | Attending: Internal Medicine | Admitting: Internal Medicine

## 2013-08-20 DIAGNOSIS — Z882 Allergy status to sulfonamides status: Secondary | ICD-10-CM

## 2013-08-20 DIAGNOSIS — R3915 Urgency of urination: Secondary | ICD-10-CM | POA: Diagnosis present

## 2013-08-20 DIAGNOSIS — R1031 Right lower quadrant pain: Secondary | ICD-10-CM | POA: Insufficient documentation

## 2013-08-20 DIAGNOSIS — R109 Unspecified abdominal pain: Secondary | ICD-10-CM

## 2013-08-20 DIAGNOSIS — R110 Nausea: Secondary | ICD-10-CM

## 2013-08-20 DIAGNOSIS — R52 Pain, unspecified: Secondary | ICD-10-CM

## 2013-08-20 DIAGNOSIS — D649 Anemia, unspecified: Secondary | ICD-10-CM | POA: Diagnosis present

## 2013-08-20 DIAGNOSIS — Z8719 Personal history of other diseases of the digestive system: Secondary | ICD-10-CM | POA: Insufficient documentation

## 2013-08-20 DIAGNOSIS — Z7982 Long term (current) use of aspirin: Secondary | ICD-10-CM | POA: Insufficient documentation

## 2013-08-20 DIAGNOSIS — Z79899 Other long term (current) drug therapy: Secondary | ICD-10-CM | POA: Insufficient documentation

## 2013-08-20 DIAGNOSIS — Z9889 Other specified postprocedural states: Secondary | ICD-10-CM

## 2013-08-20 DIAGNOSIS — R112 Nausea with vomiting, unspecified: Secondary | ICD-10-CM | POA: Insufficient documentation

## 2013-08-20 DIAGNOSIS — Z881 Allergy status to other antibiotic agents status: Secondary | ICD-10-CM

## 2013-08-20 DIAGNOSIS — Z90710 Acquired absence of both cervix and uterus: Secondary | ICD-10-CM | POA: Insufficient documentation

## 2013-08-20 DIAGNOSIS — Z905 Acquired absence of kidney: Secondary | ICD-10-CM | POA: Insufficient documentation

## 2013-08-20 DIAGNOSIS — Z87891 Personal history of nicotine dependence: Secondary | ICD-10-CM | POA: Insufficient documentation

## 2013-08-20 DIAGNOSIS — Q602 Renal agenesis, unspecified: Secondary | ICD-10-CM

## 2013-08-20 DIAGNOSIS — Z9851 Tubal ligation status: Secondary | ICD-10-CM

## 2013-08-20 DIAGNOSIS — K565 Intestinal adhesions [bands], unspecified as to partial versus complete obstruction: Principal | ICD-10-CM | POA: Diagnosis present

## 2013-08-20 LAB — COMPREHENSIVE METABOLIC PANEL
ALT: 12 U/L (ref 0–35)
BUN: 11 mg/dL (ref 6–23)
CO2: 25 mEq/L (ref 19–32)
Calcium: 9.1 mg/dL (ref 8.4–10.5)
GFR calc Af Amer: 72 mL/min — ABNORMAL LOW (ref >90)
GFR calc non Af Amer: 62 mL/min — ABNORMAL LOW (ref >90)
Glucose, Bld: 115 mg/dL — ABNORMAL HIGH (ref 70–99)
Sodium: 135 mEq/L (ref 135–145)
Total Protein: 7.1 g/dL (ref 6.0–8.3)

## 2013-08-20 LAB — CBC WITH DIFFERENTIAL/PLATELET
Basophils Relative: 0 % (ref 0–1)
Eosinophils Absolute: 0.1 10*3/uL (ref 0.0–0.7)
Eosinophils Relative: 1 % (ref 0–5)
HCT: 31.6 % — ABNORMAL LOW (ref 36.0–46.0)
Hemoglobin: 11.1 g/dL — ABNORMAL LOW (ref 12.0–15.0)
Lymphs Abs: 0.7 10*3/uL (ref 0.7–4.0)
MCH: 32.8 pg (ref 26.0–34.0)
MCHC: 35.1 g/dL (ref 30.0–36.0)
MCV: 93.5 fL (ref 78.0–100.0)
Monocytes Absolute: 0.3 10*3/uL (ref 0.1–1.0)
Monocytes Relative: 4 % (ref 3–12)
Neutrophils Relative %: 84 % — ABNORMAL HIGH (ref 43–77)
RBC: 3.38 MIL/uL — ABNORMAL LOW (ref 3.87–5.11)

## 2013-08-20 LAB — LIPASE, BLOOD: Lipase: 27 U/L (ref 11–59)

## 2013-08-20 LAB — URINALYSIS, ROUTINE W REFLEX MICROSCOPIC
Bilirubin Urine: NEGATIVE
Glucose, UA: 100 mg/dL — AB
Ketones, ur: 15 mg/dL — AB
Protein, ur: NEGATIVE mg/dL
Urobilinogen, UA: 0.2 mg/dL (ref 0.0–1.0)

## 2013-08-20 LAB — URINE MICROSCOPIC-ADD ON

## 2013-08-20 MED ORDER — HYDROMORPHONE HCL PF 1 MG/ML IJ SOLN
1.0000 mg | Freq: Once | INTRAMUSCULAR | Status: AC
  Administered 2013-08-21: 1 mg via INTRAVENOUS
  Filled 2013-08-20: qty 1

## 2013-08-20 MED ORDER — PROMETHAZINE HCL 25 MG PO TABS: 25.0000 mg | ORAL_TABLET | Freq: Four times a day (QID) | ORAL | Status: DC | PRN

## 2013-08-20 MED ORDER — SODIUM CHLORIDE 0.9 % IV BOLUS (SEPSIS)
1000.0000 mL | Freq: Once | INTRAVENOUS | Status: AC
  Administered 2013-08-21: 1000 mL via INTRAVENOUS

## 2013-08-20 MED ORDER — FENTANYL CITRATE 0.05 MG/ML IJ SOLN
50.0000 ug | INTRAMUSCULAR | Status: DC | PRN
  Administered 2013-08-20 (×3): 50 ug via INTRAVENOUS
  Filled 2013-08-20 (×3): qty 2

## 2013-08-20 MED ORDER — SODIUM CHLORIDE 0.9 % IV BOLUS (SEPSIS)
1000.0000 mL | Freq: Once | INTRAVENOUS | Status: AC
  Administered 2013-08-20: 1000 mL via INTRAVENOUS

## 2013-08-20 MED ORDER — HYDROMORPHONE HCL PF 1 MG/ML IJ SOLN
1.0000 mg | Freq: Once | INTRAMUSCULAR | Status: AC
  Administered 2013-08-20: 1 mg via INTRAVENOUS
  Filled 2013-08-20: qty 1

## 2013-08-20 MED ORDER — IOHEXOL 300 MG/ML  SOLN
20.0000 mL | INTRAMUSCULAR | Status: AC
  Administered 2013-08-20: 25 mL via ORAL

## 2013-08-20 MED ORDER — ONDANSETRON HCL 4 MG/2ML IJ SOLN
4.0000 mg | Freq: Once | INTRAMUSCULAR | Status: AC
  Administered 2013-08-20: 4 mg via INTRAVENOUS
  Filled 2013-08-20: qty 2

## 2013-08-20 MED ORDER — IOHEXOL 300 MG/ML  SOLN
100.0000 mL | Freq: Once | INTRAMUSCULAR | Status: AC | PRN
  Administered 2013-08-20: 100 mL via INTRAVENOUS

## 2013-08-20 MED ORDER — HYDROCODONE-ACETAMINOPHEN 5-325 MG PO TABS: 2.0000 | ORAL_TABLET | ORAL | Status: DC | PRN

## 2013-08-20 MED ORDER — ONDANSETRON 4 MG PO TBDP
8.0000 mg | ORAL_TABLET | Freq: Once | ORAL | Status: AC
  Administered 2013-08-21: 8 mg via ORAL
  Filled 2013-08-20: qty 2

## 2013-08-20 NOTE — ED Notes (Signed)
Pt states seen here this morning for abdominal pain. Pt was given three different pain medications to help with the pain and the pain never fully went away. Pt states the pain is centered around her umbulicus and she is also vomiting. Pt drank gaterade and gingerale and was not able to keep them down. Pt states that she has not been able to pass gas.

## 2013-08-20 NOTE — ED Provider Notes (Signed)
CSN: [id number]     Arrival date & time 08/20/13  4098 History     First MD Initiated Contact with Patient 08/20/13 0400     Chief Complaint  Patient presents with  . Abdominal Pain   (Consider location/radiation/quality/duration/timing/severity/associated sxs/prior Treatment) HPI History provided by patient. Woke up this a.m. with right lower quadrant abdominal pain, severe in and sharp in quality. Pain not radiating. No history of same. No fevers or chills. No trauma. She has associated nausea and vomited x1.  Went to bed in her normal state of health. She has not noticed any hematuria. No dysuria, urgency or frequency. Does have history of prior hysterectomy and nephrectomy  Past Medical History  Diagnosis Date  . IBS (irritable bowel syndrome)    Past Surgical History  Procedure Laterality Date  . Hysterectomy    . Disc removed  2007  . Kidney donation    . Nephrectomy transplanted organ    . Abdominal hysterectomy     Family History  Problem Relation Age of Onset  . Cancer Father   . Heart disease Mother    History  Substance Use Topics  . Smoking status: Former Smoker    Types: Cigarettes    Quit date: 08/21/1983  . Smokeless tobacco: Never Used  . Alcohol Use: 0.6 oz/week    1 Glasses of wine per week   OB History   Grav Para Term Preterm Abortions TAB SAB Ect Mult Living                 Review of Systems  Constitutional: Negative for fever and chills.  HENT: Negative for neck pain and neck stiffness.   Eyes: Negative for pain.  Respiratory: Negative for shortness of breath.   Cardiovascular: Negative for chest pain.  Gastrointestinal: Positive for nausea, vomiting and abdominal pain.  Genitourinary: Negative for dysuria.  Musculoskeletal: Negative for back pain.  Skin: Negative for rash.  Neurological: Negative for headaches.  All other systems reviewed and are negative.    Allergies  Bactrim and Sulfa antibiotics  Home Medications   Current  Outpatient Rx  Name  Route  Sig  Dispense  Refill  . aspirin EC 81 MG tablet   Oral   Take 81 mg by mouth daily.         Marland Kitchen BIOTIN PO   Oral   Take 1 tablet by mouth daily.         . Calcium-Magnesium-Zinc 333-133-5 MG TABS   Oral   Take by mouth 2 (two) times daily.         . cholecalciferol (VITAMIN D) 1000 UNITS tablet   Oral   Take 1,000 Units by mouth daily.         . Multiple Vitamin (MULTIVITAMIN) tablet   Oral   Take 1 tablet by mouth daily.          BP 125/67  Pulse 67  Temp(Src) 98.6 F (37 C) (Oral)  Resp 16  Ht 5' 4.5" (1.638 m)  SpO2 100%  LMP 08/20/1992 Physical Exam  Constitutional: She is oriented to person, place, and time. She appears well-developed and well-nourished.  HENT:  Head: Normocephalic and atraumatic.  Eyes: EOM are normal. Pupils are equal, round, and reactive to light.  Neck: Neck supple.  Cardiovascular: Normal rate, regular rhythm and intact distal pulses.   Pulmonary/Chest: Effort normal and breath sounds normal. No respiratory distress.  Abdominal: Soft.  Tender right lower quad with voluntary guarding  Musculoskeletal: Normal  range of motion. She exhibits no edema.  Neurological: She is alert and oriented to person, place, and time.  Skin: Skin is warm and dry.    ED Course   Procedures (including critical care time)  Results for orders placed during the hospital encounter of 08/20/13  CBC WITH DIFFERENTIAL      Result Value Range   WBC 6.3  4.0 - 10.5 K/uL   RBC 3.38 (*) 3.87 - 5.11 MIL/uL   Hemoglobin 11.1 (*) 12.0 - 15.0 g/dL   HCT 16.1 (*) 09.6 - 04.5 %   MCV 93.5  78.0 - 100.0 fL   MCH 32.8  26.0 - 34.0 pg   MCHC 35.1  30.0 - 36.0 g/dL   RDW 40.9  81.1 - 91.4 %   Platelets 174  150 - 400 K/uL   Neutrophils Relative % 84 (*) 43 - 77 %   Neutro Abs 5.3  1.7 - 7.7 K/uL   Lymphocytes Relative 11 (*) 12 - 46 %   Lymphs Abs 0.7  0.7 - 4.0 K/uL   Monocytes Relative 4  3 - 12 %   Monocytes Absolute 0.3  0.1 -  1.0 K/uL   Eosinophils Relative 1  0 - 5 %   Eosinophils Absolute 0.1  0.0 - 0.7 K/uL   Basophils Relative 0  0 - 1 %   Basophils Absolute 0.0  0.0 - 0.1 K/uL  URINALYSIS, ROUTINE W REFLEX MICROSCOPIC      Result Value Range   Color, Urine YELLOW  YELLOW   APPearance TURBID (*) CLEAR   Specific Gravity, Urine 1.022  1.005 - 1.030   pH 6.5  5.0 - 8.0   Glucose, UA 100 (*) NEGATIVE mg/dL   Hgb urine dipstick SMALL (*) NEGATIVE   Bilirubin Urine NEGATIVE  NEGATIVE   Ketones, ur 15 (*) NEGATIVE mg/dL   Protein, ur NEGATIVE  NEGATIVE mg/dL   Urobilinogen, UA 0.2  0.0 - 1.0 mg/dL   Nitrite NEGATIVE  NEGATIVE   Leukocytes, UA NEGATIVE  NEGATIVE  COMPREHENSIVE METABOLIC PANEL      Result Value Range   Sodium 135  135 - 145 mEq/L   Potassium 4.0  3.5 - 5.1 mEq/L   Chloride 101  96 - 112 mEq/L   CO2 25  19 - 32 mEq/L   Glucose, Bld 115 (*) 70 - 99 mg/dL   BUN 11  6 - 23 mg/dL   Creatinine, Ser 7.82  0.50 - 1.10 mg/dL   Calcium 9.1  8.4 - 95.6 mg/dL   Total Protein 7.1  6.0 - 8.3 g/dL   Albumin 3.8  3.5 - 5.2 g/dL   AST 21  0 - 37 U/L   ALT 12  0 - 35 U/L   Alkaline Phosphatase 49  39 - 117 U/L   Total Bilirubin 0.3  0.3 - 1.2 mg/dL   GFR calc non Af Amer 62 (*) >90 mL/min   GFR calc Af Amer 72 (*) >90 mL/min  LIPASE, BLOOD      Result Value Range   Lipase 27  11 - 59 U/L  URINE MICROSCOPIC-ADD ON      Result Value Range   Squamous Epithelial / LPF RARE  RARE   RBC / HPF 3-6  <3 RBC/hpf   Bacteria, UA RARE  RARE   Ct Abdomen Pelvis W Contrast  08/20/2013   *RADIOLOGY REPORT*  Clinical Data: Acute onset right lower quadrant pain, nausea and vomiting.  CT ABDOMEN  AND PELVIS WITH CONTRAST  Technique:  Multidetector CT imaging of the abdomen and pelvis was performed following the standard protocol during bolus administration of intravenous contrast.  Contrast: 1 OMNIPAQUE IOHEXOL 300 MG/ML  SOLN, OMNIPAQUE IOHEXOL 300 MG/ML  SOLN  Comparison: None.  Findings: Lung bases are  clear.  The liver, spleen, gallbladder, pancreas, adrenal glands, solitary right kidney, abdominal aorta, inferior vena cava, and retroperitoneal lymph nodes are unremarkable.  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  The pyloric region of the stomach is thickened and decompressed.  This may just be due to normal peristalsis but inflammatory change is not excluded. Clinical correlation for peptic ulcer disease is recommended.  The small bowel are decompressed.  Gas and stool in the colon without distension.  No free air or free fluid in the abdomen.  Pelvis:  Fluid filled mildly dilated pelvic small bowel loops could represent enteritis.  No significant wall thickening.  Small amount of free fluid in the pelvis of nonspecific etiology, possibly inflammatory.  There appears to be surgical absence of the uterus. No abnormal adnexal masses.  The appendix is not identified. Bladder wall is not thickened.  Normal alignment of the lumbar spine.  No destructive bone lesions appreciated.  The study is technically limited due to lack of body fat.  IMPRESSION: Surgical absence of the left kidney.  There is nonspecific appearance of the pyloric region of the stomach which may just be due to normal peristalsis but inflammatory change is not excluded. There is mild prominence of fluid-filled small bowel loops in the pelvis which could represent enteritis.  Small amount of free fluid in the pelvis may be reactive.   Original Report Authenticated By: Burman Nieves, M.D.     Date: 08/20/2013  Rate: 62  Rhythm: normal sinus rhythm  QRS Axis: normal  Intervals: normal  ST/T Wave abnormalities: nonspecific ST changes  Conduction Disutrbances:none  Narrative Interpretation:   Old EKG Reviewed: No significant changes  IV fluids. IV narcotics.  6:54 AM on recheck is feeling much better still some mild pain. CT scan reviewed as above. No acute abdomen on repeat exam. Tolerating oral  fluids.  Plan discharge home with prescription pain medications, nausea medications and followup primary care physician. Return precautions verbalizes understood.  MDM  Abdominal pain with nausea  Improved with IV fluids and IV narcotics  CT scan labs and urinalysis reviewed as above       Sunnie Nielsen, MD 08/20/13 [phone]

## 2013-08-20 NOTE — ED Notes (Signed)
Patient discharged to home with family. NAD.  

## 2013-08-20 NOTE — ED Notes (Signed)
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

## 2013-08-21 ENCOUNTER — Inpatient Hospital Stay (HOSPITAL_COMMUNITY): Payer: 59

## 2013-08-21 ENCOUNTER — Encounter (HOSPITAL_COMMUNITY): Payer: Self-pay | Admitting: Internal Medicine

## 2013-08-21 DIAGNOSIS — D649 Anemia, unspecified: Secondary | ICD-10-CM | POA: Diagnosis present

## 2013-08-21 DIAGNOSIS — R112 Nausea with vomiting, unspecified: Secondary | ICD-10-CM

## 2013-08-21 DIAGNOSIS — R109 Unspecified abdominal pain: Secondary | ICD-10-CM

## 2013-08-21 LAB — CBC WITH DIFFERENTIAL/PLATELET
Eosinophils Absolute: 0 10*3/uL (ref 0.0–0.7)
Hemoglobin: 11.3 g/dL — ABNORMAL LOW (ref 12.0–15.0)
Lymphocytes Relative: 10 % — ABNORMAL LOW (ref 12–46)
Lymphs Abs: 0.8 10*3/uL (ref 0.7–4.0)
Lymphs Abs: 0.9 10*3/uL (ref 0.7–4.0)
MCH: 31.6 pg (ref 26.0–34.0)
MCHC: 33.9 g/dL (ref 30.0–36.0)
MCV: 93.2 fL (ref 78.0–100.0)
Monocytes Absolute: 0.4 10*3/uL (ref 0.1–1.0)
Monocytes Relative: 4 % (ref 3–12)
Neutro Abs: 7.1 10*3/uL (ref 1.7–7.7)
Neutrophils Relative %: 83 % — ABNORMAL HIGH (ref 43–77)
Neutrophils Relative %: 86 % — ABNORMAL HIGH (ref 43–77)
Platelets: 146 10*3/uL — ABNORMAL LOW (ref 150–400)
Platelets: 195 10*3/uL (ref 150–400)
RBC: 3.53 MIL/uL — ABNORMAL LOW (ref 3.87–5.11)
WBC: 8.3 10*3/uL (ref 4.0–10.5)

## 2013-08-21 LAB — COMPREHENSIVE METABOLIC PANEL
ALT: 12 U/L (ref 0–35)
AST: 23 U/L (ref 0–37)
Albumin: 3.5 g/dL (ref 3.5–5.2)
Albumin: 3.7 g/dL (ref 3.5–5.2)
Alkaline Phosphatase: 42 U/L (ref 39–117)
BUN: 10 mg/dL (ref 6–23)
CO2: 23 mEq/L (ref 19–32)
CO2: 24 mEq/L (ref 19–32)
Calcium: 8.7 mg/dL (ref 8.4–10.5)
Chloride: 103 mEq/L (ref 96–112)
Chloride: 98 mEq/L (ref 96–112)
Creatinine, Ser: 0.88 mg/dL (ref 0.50–1.10)
Creatinine, Ser: 1.03 mg/dL (ref 0.50–1.10)
GFR calc non Af Amer: 63 mL/min — ABNORMAL LOW (ref >90)
GFR calc non Af Amer: 76 mL/min — ABNORMAL LOW (ref >90)
Potassium: 4.1 mEq/L (ref 3.5–5.1)
Sodium: 137 mEq/L (ref 135–145)
Total Bilirubin: 0.5 mg/dL (ref 0.3–1.2)
Total Bilirubin: 0.5 mg/dL (ref 0.3–1.2)

## 2013-08-21 LAB — LIPASE, BLOOD
Lipase: 18 U/L (ref 11–59)
Lipase: 19 U/L (ref 11–59)

## 2013-08-21 LAB — LACTIC ACID, PLASMA: Lactic Acid, Venous: 1.3 mmol/L (ref 0.5–2.2)

## 2013-08-21 LAB — GLUCOSE, CAPILLARY
Glucose-Capillary: 77 mg/dL (ref 70–99)
Glucose-Capillary: 86 mg/dL (ref 70–99)

## 2013-08-21 MED ORDER — PANTOPRAZOLE SODIUM 40 MG IV SOLR
40.0000 mg | Freq: Two times a day (BID) | INTRAVENOUS | Status: DC
  Administered 2013-08-21 – 2013-08-28 (×15): 40 mg via INTRAVENOUS
  Filled 2013-08-21 (×18): qty 40

## 2013-08-21 MED ORDER — ACETAMINOPHEN 650 MG RE SUPP
650.0000 mg | Freq: Four times a day (QID) | RECTAL | Status: DC | PRN
  Administered 2013-08-24: 650 mg via RECTAL
  Filled 2013-08-21: qty 1

## 2013-08-21 MED ORDER — ONDANSETRON HCL 4 MG/2ML IJ SOLN
4.0000 mg | Freq: Four times a day (QID) | INTRAMUSCULAR | Status: DC | PRN
  Administered 2013-08-21 – 2013-08-25 (×4): 4 mg via INTRAVENOUS
  Filled 2013-08-21 (×4): qty 2

## 2013-08-21 MED ORDER — TRAMADOL HCL 50 MG PO TABS
50.0000 mg | ORAL_TABLET | Freq: Four times a day (QID) | ORAL | Status: DC | PRN
  Administered 2013-08-24: 50 mg via ORAL
  Filled 2013-08-21: qty 1

## 2013-08-21 MED ORDER — ONDANSETRON HCL 4 MG PO TABS: 4.0000 mg | ORAL_TABLET | Freq: Four times a day (QID) | ORAL | Status: DC | PRN

## 2013-08-21 MED ORDER — HYDROMORPHONE HCL PF 1 MG/ML IJ SOLN
1.0000 mg | Freq: Once | INTRAMUSCULAR | Status: AC
  Administered 2013-08-21: 1 mg via INTRAVENOUS
  Filled 2013-08-21: qty 1

## 2013-08-21 MED ORDER — ONDANSETRON HCL 4 MG/2ML IJ SOLN: 4.0000 mg | Freq: Three times a day (TID) | INTRAMUSCULAR | Status: DC | PRN

## 2013-08-21 MED ORDER — MORPHINE SULFATE 2 MG/ML IJ SOLN
1.0000 mg | INTRAMUSCULAR | Status: DC | PRN
  Administered 2013-08-21 – 2013-08-23 (×13): 1 mg via INTRAVENOUS
  Filled 2013-08-21 (×13): qty 1

## 2013-08-21 MED ORDER — SODIUM CHLORIDE 0.9 % IV SOLN
INTRAVENOUS | Status: AC
  Administered 2013-08-21 – 2013-08-22 (×3): via INTRAVENOUS

## 2013-08-21 MED ORDER — MORPHINE SULFATE 2 MG/ML IJ SOLN
1.0000 mg | INTRAMUSCULAR | Status: DC | PRN
  Administered 2013-08-21 (×2): 1 mg via INTRAVENOUS
  Filled 2013-08-21 (×2): qty 1

## 2013-08-21 MED ORDER — ACETAMINOPHEN 325 MG PO TABS
650.0000 mg | ORAL_TABLET | Freq: Four times a day (QID) | ORAL | Status: DC | PRN
  Filled 2013-08-21: qty 2

## 2013-08-21 MED ORDER — SODIUM CHLORIDE 0.9 % IV SOLN: INTRAVENOUS | Status: DC

## 2013-08-21 NOTE — ED Notes (Signed)
Patient transported to X-ray 

## 2013-08-21 NOTE — Consult Note (Signed)
Reason for Consult: SBO  Referring Physician: Dr. Zacarias Buckley is an 52 y.o. female.  HPI: Pt is admitted with ongoing pain, nausea and vomiting.  She says she woke up about 11:45 Pm Saturday evening with acute onset of sharpe  abdominal pain she points to the mid epigastric area, going to her back. She say pain was 12/10, she then developed nausea and vomiting.  She went to the ER early Sunday AM.  CT scan (08/20/13@ 1191) shows surgically absent left kidney and some nonspecific inflammatory changes around the pyloric region.  There was some fluid filled SB and a question of enteritis.  She was discharged home after 3 different treatments for pain, none of which resolved her problem.  She said she was still having nausea and vomiting when she was sent home. She got some relief and was able to sleep some during the day yesterday.  Around 17:30 she had more nausea and vomiting at home along with pain which she could not tolerate and came back to the ER about 21:30 last PM.  A flat plate film around 03;15 this Am shows a persistent nonspecific bowel pattern, and mild distend SB loops, no suggestion of complete obstruction. Labs for both ER evaluations are unremarkable.  Medicine has attempted to place an NG, she says she has not had any nausea/vomiting since last pm but continues to complain of abdominal pain.  More severe than the exam would suggest.  Past Medical History  Diagnosis Date   . IBS (irritable bowel syndrome) She says she treats this now with specific diet.      Hospitalized for abd pain with EGD 2001, reported negative findings  Dr. Russella Buckley   Hospitalized for atypical chest pain 2008    Remote hx of tobacco use.    Seen for diarrhea and fever 04/2013     Past Surgical History  Procedure Laterality Date  . Hysterectomy  21 years ago  . Disc removed C4-5 08/2002   . Kidney donation    . Nephrectomy transplanted organ (Laparoscopically assisted.)  March 2014   EGD 2001    .  Abdominal hysterectomy      Family History  Problem Relation Age of Onset  . Cancer Father   . Heart disease Mother     Social History:  reports that she quit smoking about 30 years ago. Her smoking use included Cigarettes. She smoked 0.00 packs per day. She has never used smokeless tobacco. She reports that she drinks about 0.6 ounces of alcohol per week. She reports that she does not use illicit drugs.  Allergies:  Allergies  Allergen Reactions  . Bactrim [Sulfamethoxazole W-Trimethoprim] Shortness Of Breath  . Sulfa Antibiotics Shortness Of Breath    Medications:  Prior to Admission:  Prescriptions prior to admission  Medication Sig Dispense Refill  . aspirin EC 81 MG tablet Take 81 mg by mouth daily.      Marland Kitchen BIOTIN PO Take 1 tablet by mouth daily.      . Calcium-Magnesium-Zinc 333-133-5 MG TABS Take by mouth 2 (two) times daily.      . cholecalciferol (VITAMIN D) 1000 UNITS tablet Take 1,000 Units by mouth daily.      Marland Kitchen HYDROcodone-acetaminophen (NORCO/VICODIN) 5-325 MG per tablet Take 2 tablets by mouth every 4 (four) hours as needed for pain.      . Multiple Vitamin (MULTIVITAMIN) tablet Take 1 tablet by mouth daily.      . promethazine (PHENERGAN) 25 MG tablet  Take 25 mg by mouth every 6 (six) hours as needed for nausea.      Current facility-administered medications:0.9 %  sodium chloride infusion, , Intravenous, Continuous, Jane Clos, MD, Last Rate: 100 mL/hr at 08/21/13 0421;  acetaminophen (TYLENOL) suppository 650 mg, 650 mg, Rectal, Q6H PRN, Jane Clos, MD;  acetaminophen (TYLENOL) tablet 650 mg, 650 mg, Oral, Q6H PRN, Jane Clos, MD;  morphine 2 MG/ML injection 1 mg, 1 mg, Intravenous, Q2H PRN, Jane Art, DO, 1 mg at 08/21/13 1127 ondansetron (ZOFRAN) injection 4 mg, 4 mg, Intravenous, Q6H PRN, Jane Clos, MD;  ondansetron [facility]) tablet 4 mg, 4 mg, Oral, Q6H PRN, Jane Clos, MD;  traMADol Jane Buckley) tablet 50 mg, 50 mg,  Oral, Q6H PRN, Jane Art, DO  Results for orders placed during the hospital encounter of 08/20/13 (from the past 48 hour(s))  COMPREHENSIVE METABOLIC PANEL     Status: Abnormal   Collection Time    08/21/13 12:14 AM      Result Value Range   Sodium 133 (*) 135 - 145 mEq/L   Potassium 3.6  3.5 - 5.1 mEq/L   Chloride 98  96 - 112 mEq/L   CO2 23  19 - 32 mEq/L   Glucose, Bld 102 (*) 70 - 99 mg/dL   BUN 10  6 - 23 mg/dL   Creatinine, Ser 1.19  0.50 - 1.10 mg/dL   Calcium 8.7  8.4 - 14.7 mg/dL   Total Protein 7.0  6.0 - 8.3 g/dL   Albumin 3.7  3.5 - 5.2 g/dL   AST 19  0 - 37 U/L   ALT 13  0 - 35 U/L   Alkaline Phosphatase 46  39 - 117 U/L   Total Bilirubin 0.5  0.3 - 1.2 mg/dL   GFR calc non Af Amer 76 (*) >90 mL/min   GFR calc Af Amer 88 (*) >90 mL/min   Comment: (NOTE)     The eGFR has been calculated using the CKD EPI equation.     This calculation has not been validated in all clinical situations.     eGFR's persistently <90 mL/min signify possible Chronic Kidney     Disease.  CBC WITH DIFFERENTIAL     Status: Abnormal   Collection Time    08/21/13 12:14 AM      Result Value Range   WBC 8.3  4.0 - 10.5 K/uL   RBC 3.53 (*) 3.87 - 5.11 MIL/uL   Hemoglobin 11.3 (*) 12.0 - 15.0 g/dL   HCT 82.9 (*) 56.2 - 13.0 %   MCV 92.9  78.0 - 100.0 fL   MCH 32.0  26.0 - 34.0 pg   MCHC 34.5  30.0 - 36.0 g/dL   RDW 86.5  78.4 - 69.6 %   Platelets 195  150 - 400 K/uL   Neutrophils Relative % 86 (*) 43 - 77 %   Neutro Abs 7.1  1.7 - 7.7 K/uL   Lymphocytes Relative 10 (*) 12 - 46 %   Lymphs Abs 0.8  0.7 - 4.0 K/uL   Monocytes Relative 4  3 - 12 %   Monocytes Absolute 0.4  0.1 - 1.0 K/uL   Eosinophils Relative 0  0 - 5 %   Eosinophils Absolute 0.0  0.0 - 0.7 K/uL   Basophils Relative 0  0 - 1 %   Basophils Absolute 0.0  0.0 - 0.1 K/uL  LIPASE, BLOOD     Status: None  Collection Time    08/21/13 12:14 AM      Result Value Range   Lipase 19  11 - 59 U/L  LACTIC ACID, PLASMA      Status: None   Collection Time    08/21/13 12:14 AM      Result Value Range   Lactic Acid, Venous 1.3  0.5 - 2.2 mmol/L  COMPREHENSIVE METABOLIC PANEL     Status: Abnormal   Collection Time    08/21/13  5:15 AM      Result Value Range   Sodium 137  135 - 145 mEq/L   Potassium 4.1  3.5 - 5.1 mEq/L   Chloride 103  96 - 112 mEq/L   CO2 24  19 - 32 mEq/L   Glucose, Bld 93  70 - 99 mg/dL   BUN 11  6 - 23 mg/dL   Creatinine, Ser 4.54  0.50 - 1.10 mg/dL   Calcium 8.7  8.4 - 09.8 mg/dL   Total Protein 6.8  6.0 - 8.3 g/dL   Albumin 3.5  3.5 - 5.2 g/dL   AST 23  0 - 37 U/L   ALT 12  0 - 35 U/L   Alkaline Phosphatase 42  39 - 117 U/L   Total Bilirubin 0.5  0.3 - 1.2 mg/dL   GFR calc non Af Amer 63 (*) >90 mL/min   GFR calc Af Amer 73 (*) >90 mL/min   Comment: (NOTE)     The eGFR has been calculated using the CKD EPI equation.     This calculation has not been validated in all clinical situations.     eGFR's persistently <90 mL/min signify possible Chronic Kidney     Disease.  CBC WITH DIFFERENTIAL     Status: Abnormal   Collection Time    08/21/13  5:15 AM      Result Value Range   WBC 7.6  4.0 - 10.5 K/uL   Comment: WHITE COUNT CONFIRMED ON SMEAR   RBC 3.96  3.87 - 5.11 MIL/uL   Hemoglobin 12.5  12.0 - 15.0 g/dL   HCT 11.9  14.7 - 82.9 %   MCV 93.2  78.0 - 100.0 fL   MCH 31.6  26.0 - 34.0 pg   MCHC 33.9  30.0 - 36.0 g/dL   RDW 56.2  13.0 - 86.5 %   Platelets 146 (*) 150 - 400 K/uL   Comment: PLATELET COUNT CONFIRMED BY SMEAR   Neutrophils Relative % 83 (*) 43 - 77 %   Lymphocytes Relative 12  12 - 46 %   Monocytes Relative 5  3 - 12 %   Eosinophils Relative 0  0 - 5 %   Basophils Relative 0  0 - 1 %   Neutro Abs 6.3  1.7 - 7.7 K/uL   Lymphs Abs 0.9  0.7 - 4.0 K/uL   Monocytes Absolute 0.4  0.1 - 1.0 K/uL   Eosinophils Absolute 0.0  0.0 - 0.7 K/uL   Basophils Absolute 0.0  0.0 - 0.1 K/uL  LIPASE, BLOOD     Status: None   Collection Time    08/21/13  5:15 AM      Result  Value Range   Lipase 18  11 - 59 U/L    Dg Abd 1 View  08/21/2013   *RADIOLOGY REPORT*  Clinical Data: Right lower quadrant pain  ABDOMEN - 1 VIEW  Comparison: 08/20/2013 CT  Findings: The bowel gas pattern is non- specific without overt  obstruction. Again, there is a mildly prominent small bowel loop over the pelvis measuring 3.5 cm in diameter, though air is noted within normal caliber colon. Contrast within the gallbladder is presumably secondary to vicarious excretion.  There is a surgical clip projecting over the left upper quadrant.  The hemidiaphragms are excluded from the image. Otherwise, organ outlines are normal where seen. No acute or aggressive osseous abnormality identified.  IMPRESSION: Nonspecific bowel gas pattern with a persistent mildly distended small bowel loop and no evidence for complete obstruction.   Original Report Authenticated By: Jane Buckley, M.D.   Ct Abdomen Pelvis W Contrast  08/20/2013   *RADIOLOGY REPORT*  Clinical Data: Acute onset right lower quadrant pain, nausea and vomiting.  CT ABDOMEN AND PELVIS WITH CONTRAST  Technique:  Multidetector CT imaging of the abdomen and pelvis was performed following the standard protocol during bolus administration of intravenous contrast.  Contrast: 1 OMNIPAQUE IOHEXOL 300 MG/ML  SOLN, OMNIPAQUE IOHEXOL 300 MG/ML  SOLN  Comparison: None.  Findings: Lung bases are clear.  The liver, spleen, gallbladder, pancreas, adrenal glands, solitary right kidney, abdominal aorta, inferior vena cava, and retroperitoneal lymph nodes are unremarkable.  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  The pyloric region of the stomach is thickened and decompressed.  This may just be due to normal peristalsis but inflammatory change is not excluded. Clinical correlation for peptic ulcer disease is recommended.  The small bowel are decompressed.  Gas and stool in the colon without distension.  No free air or free fluid in the  abdomen.  Pelvis:  Fluid filled mildly dilated pelvic small bowel loops could represent enteritis.  No significant wall thickening.  Small amount of free fluid in the pelvis of nonspecific etiology, possibly inflammatory.  There appears to be surgical absence of the uterus. No abnormal adnexal masses.  The appendix is not identified. Bladder wall is not thickened.  Normal alignment of the lumbar spine.  No destructive bone lesions appreciated.  The study is technically limited due to lack of body fat.  IMPRESSION: Surgical absence of the left kidney.  There is nonspecific appearance of the pyloric region of the stomach which may just be due to normal peristalsis but inflammatory change is not excluded. There is mild prominence of fluid-filled small bowel loops in the pelvis which could represent enteritis.  Small amount of free fluid in the pelvis may be reactive.   Original Report Authenticated By: Jane Buckley, M.D.    Review of Systems  Constitutional: Negative for fever, chills, weight loss, malaise/fatigue and diaphoresis.  HENT: Negative.   Eyes: Negative.   Respiratory: Negative.   Cardiovascular: Negative.   Gastrointestinal: Positive for heartburn (in the past, but controls it with diet.), nausea, vomiting, abdominal pain (she complains of abdominal pain she equates with giving birth.) and constipation (hard stool on Saturday which is new.). Negative for diarrhea, blood in stool and melena.       Acute onset of pain Saturday PM12:45, followed by nausea and vomiting. She describes 12/10 pain. Nothing like this before.  Genitourinary: Negative for dysuria, urgency, frequency, hematuria and flank pain.       Incontinence with vomiting.  Musculoskeletal: Negative.   Skin: Negative.   Neurological: Negative.  Negative for weakness.  Endo/Heme/Allergies: Negative.   Psychiatric/Behavioral: Negative.    Blood pressure 122/63, pulse 60, temperature 98 F (36.7 C), temperature source Oral,  resp. rate 16, last menstrual period 08/20/1992, SpO2 99.00%. Physical Exam  Constitutional: She is oriented to person, place, and time. She appears well-developed. No distress.  Thin female, who still complains of significant pain.  But does not appear to be having that much pain.  Speech is slow and clear, no tachycardia or tachypnea. BP 122/63  Pulse 60  Temp(Src) 98 F (36.7 C) (Oral)  Resp 16  SpO2 99%  LMP 08/20/1992   HENT:  Head: Normocephalic and atraumatic.  Nose: Nose normal.  Eyes: Conjunctivae and EOM are normal. Pupils are equal, round, and reactive to light. Right eye exhibits no discharge. Left eye exhibits no discharge. No scleral icterus.  Neck: Normal range of motion. Neck supple. No JVD present. No tracheal deviation present. No thyromegaly present.  Cardiovascular: Normal rate, regular rhythm, normal heart sounds and intact distal pulses.  Exam reveals no gallop.   No murmur heard. Respiratory: Effort normal and breath sounds normal. No respiratory distress. She has no wheezes. She has no rales. She exhibits no tenderness.  GI: Soft. She exhibits distension (minimal). She exhibits no mass. There is tenderness (she says she is tender on both left and right abdomen.  she sits up without much effort, and her abdomen is soft.  ). There is no rebound and no guarding.  She says she is distended but it is minimal on exam to me.  Musculoskeletal: She exhibits no edema.  Lymphadenopathy:    She has no cervical adenopathy.  Neurological: She is alert and oriented to person, place, and time. No cranial nerve deficit.  Skin: Skin is warm and dry. No rash noted. She is not diaphoretic. No erythema. No pallor.  Psychiatric: She has a normal mood and affect. Her behavior is normal. Judgment and thought content normal.  She does not appear to be in as much pain as she describes.    Assessment/Plan: 1.  Abdominal pain, nausea and vomiting, with some small bowel dilatation.   2.   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  Hx of IBS, hx of abdominal pain back in 2001, no etiology 4.  S/p hysterectomy  Plan:  Labs and films are from 5AM.  She has some minimal distension on exam, no further nausea, but pain reported is more than I would have thought from the exam.  I will add PPI, treat pain symptomatically for now, NPO, with bowel rest.  They failed to get an NG in, and on exam she doesn't really need it right now.  I would repeat labs and film in Am  Jane Buckley 08/21/2013, 11:52 AM

## 2013-08-21 NOTE — Progress Notes (Addendum)
Patient seen and examined by me. Admitted early this AM by Dr. Toniann Fail.  Patient is having severe abdominal with even the slightest palpation. Difficult to hear bowel sounds.  Not passing gas.  Surgery to see- recommended NG tube for decompression for now. -solitary kidney  Marlin Canary DO

## 2013-08-21 NOTE — Progress Notes (Signed)
Unsuccessful attempt x3 at insertion of NGT. MD notified.

## 2013-08-21 NOTE — ED Provider Notes (Signed)
CSN: [id number]     Arrival date & time 08/20/13  2206 History     First MD Initiated Contact with Patient 08/20/13 2257     Chief Complaint  Patient presents with  . Abdominal Pain   (Consider location/radiation/quality/duration/timing/severity/associated sxs/prior Treatment) HPI Comments: 52 yo female with hysterectomy hx presents with generalized abd pain, severe burning, sensitive to palpation/ touch since this am. Pt has donated a kidney in the past.  She had CT w contrast with no acute findings, possible enteritis.  Denies GI bleeding. No hx of similar.  Radiates to back.  No etoh or smoking.  Nothing improves, constant.    Patient is a 52 y.o. female presenting with abdominal pain. The history is provided by the patient.  Abdominal Pain Pain location:  Generalized Associated symptoms: no chest pain, no chills, no dysuria, no fever, no shortness of breath and no vomiting     Past Medical History  Diagnosis Date  . IBS (irritable bowel syndrome)    Past Surgical History  Procedure Laterality Date  . Hysterectomy    . Disc removed  2007  . Kidney donation    . Nephrectomy transplanted organ    . Abdominal hysterectomy     Family History  Problem Relation Age of Onset  . Cancer Father   . Heart disease Mother    History  Substance Use Topics  . Smoking status: Former Smoker    Types: Cigarettes    Quit date: 08/21/1983  . Smokeless tobacco: Never Used  . Alcohol Use: 0.6 oz/week    1 Glasses of wine per week   OB History   Grav Para Term Preterm Abortions TAB SAB Ect Mult Living                 Review of Systems  Constitutional: Positive for appetite change. Negative for fever and chills.  HENT: Negative for neck pain and neck stiffness.   Eyes: Negative for visual disturbance.  Respiratory: Negative for shortness of breath.   Cardiovascular: Negative for chest pain.  Gastrointestinal: Positive for abdominal pain. Negative for vomiting and blood in stool.   Genitourinary: Negative for dysuria and flank pain.  Musculoskeletal: Negative for back pain.  Skin: Negative for rash.  Neurological: Negative for light-headedness and headaches.    Allergies  Bactrim and Sulfa antibiotics  Home Medications   Current Outpatient Rx  Name  Route  Sig  Dispense  Refill  . aspirin EC 81 MG tablet   Oral   Take 81 mg by mouth daily.         Marland Kitchen BIOTIN PO   Oral   Take 1 tablet by mouth daily.         . Calcium-Magnesium-Zinc 333-133-5 MG TABS   Oral   Take by mouth 2 (two) times daily.         . cholecalciferol (VITAMIN D) 1000 UNITS tablet   Oral   Take 1,000 Units by mouth daily.         Marland Kitchen HYDROcodone-acetaminophen (NORCO/VICODIN) 5-325 MG per tablet   Oral   Take 2 tablets by mouth every 4 (four) hours as needed for pain.         . Multiple Vitamin (MULTIVITAMIN) tablet   Oral   Take 1 tablet by mouth daily.         . promethazine (PHENERGAN) 25 MG tablet   Oral   Take 25 mg by mouth every 6 (six) hours as needed for nausea.  BP 149/69  Pulse 68  Temp(Src) 98.4 F (36.9 C) (Oral)  Resp 16  SpO2 100%  LMP 08/20/1992 Physical Exam  Nursing note and vitals reviewed. Constitutional: She is oriented to person, place, and time. She appears well-developed and well-nourished.  HENT:  Head: Normocephalic and atraumatic.  Mild dry mm  Eyes: Conjunctivae are normal. Right eye exhibits no discharge. Left eye exhibits no discharge.  Neck: Normal range of motion. Neck supple. No tracheal deviation present.  Cardiovascular: Normal rate and regular rhythm.   Pulmonary/Chest: Effort normal and breath sounds normal.  Abdominal: Soft. She exhibits no distension. There is tenderness (diffuse moderate, worse central). There is no guarding.  Musculoskeletal: She exhibits no edema.  Neurological: She is alert and oriented to person, place, and time.  Skin: Skin is warm. No rash noted.  Psychiatric: She has a normal mood  and affect.    ED Course   Procedures (including critical care time)  Labs Reviewed  URINALYSIS, ROUTINE W REFLEX MICROSCOPIC  COMPREHENSIVE METABOLIC PANEL  CBC WITH DIFFERENTIAL  LIPASE, BLOOD  LACTIC ACID, PLASMA   Ct Abdomen Pelvis W Contrast  08/20/2013   *RADIOLOGY REPORT*  Clinical Data: Acute onset right lower quadrant pain, nausea and vomiting.  CT ABDOMEN AND PELVIS WITH CONTRAST  Technique:  Multidetector CT imaging of the abdomen and pelvis was performed following the standard protocol during bolus administration of intravenous contrast.  Contrast: 1 OMNIPAQUE IOHEXOL 300 MG/ML  SOLN, OMNIPAQUE IOHEXOL 300 MG/ML  SOLN  Comparison: None.  Findings: Lung bases are clear.  The liver, spleen, gallbladder, pancreas, adrenal glands, solitary right kidney, abdominal aorta, inferior vena cava, and retroperitoneal lymph nodes are unremarkable.  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  The pyloric region of the stomach is thickened and decompressed.  This may just be due to normal peristalsis but inflammatory change is not excluded. Clinical correlation for peptic ulcer disease is recommended.  The small bowel are decompressed.  Gas and stool in the colon without distension.  No free air or free fluid in the abdomen.  Pelvis:  Fluid filled mildly dilated pelvic small bowel loops could represent enteritis.  No significant wall thickening.  Small amount of free fluid in the pelvis of nonspecific etiology, possibly inflammatory.  There appears to be surgical absence of the uterus. No abnormal adnexal masses.  The appendix is not identified. Bladder wall is not thickened.  Normal alignment of the lumbar spine.  No destructive bone lesions appreciated.  The study is technically limited due to lack of body fat.  IMPRESSION: Surgical absence of the left kidney.  There is nonspecific appearance of the pyloric region of the stomach which may just be due to normal peristalsis  but inflammatory change is not excluded. There is mild prominence of fluid-filled small bowel loops in the pelvis which could represent enteritis.  Small amount of free fluid in the pelvis may be reactive.   Original Report Authenticated By: Burman Nieves, M.D.   No diagnosis found.  MDM  No finding for pain on recent work up, with one kidney and same pain no indication for repeat CT scan. Plan for repeat labs, lipase, fluids, pain meds. Pain 9/10 on recheck.  No known cause for pain.  - viral vs IBS vs other.  Pain meds repeated.  Discussed outpt vs observation, pt not comfortable going home with pain. Spoke with Toniann Fail, observation for pain control and eval. Recent CT results reviewed.  Fluids given.  Enid Skeens, MD 08/21/13 [phone]

## 2013-08-21 NOTE — H&P (Signed)
[facility] History and Physical  Jane Buckley ZOX:[id number] DOB: 1961-02-19 DOA: 08/20/2013  Referring physician: ER physician. PCP: Jane Alken, MD   Chief Complaint: Abdominal pain and nausea and vomiting.  HPI: Jane Buckley is a 52 y.o. female with no significant past medical history presents with complaints of abdominal pain and nausea and vomiting. Patient had presented early yesterday 3 AM with similar complaints and at that time had CT abdomen pelvis which showed features concerning for enteritis. Patient was discharged home on pain medications. Since patient has benign persistent nausea and vomiting with abdominal pain patient return back to the ER. The pain is mostly located periumbilical crampy in nature. Denies any diarrhea. Patient had multiple episodes of nausea vomiting denies any blood in it. Patient had similar episode 4 months ago and was admitted at [facility]. Patient presently has been admitted for further management since patient has been having intractable nausea vomiting with abdominal pain. Patient denies any fever chills or recent travel. Patient denies any sick contacts. Patient denies having taken any recent antibiotics.  Review of Systems: As presented in the history of presenting illness, rest negative.  Past Medical History  Diagnosis Date  . IBS (irritable bowel syndrome)    Past Surgical History  Procedure Laterality Date  . Hysterectomy    . Disc removed  2007  . Kidney donation    . Nephrectomy transplanted organ    . Abdominal hysterectomy     Social History:  reports that she quit smoking about 30 years ago. Her smoking use included Cigarettes. She smoked 0.00 packs per day. She has never used smokeless tobacco. She reports that she drinks about 0.6 ounces of alcohol per week. She reports that she does not use illicit drugs. Home. where does patient live-- Can do ADLs. Can patient participate in ADLs?  Allergies   Allergen Reactions  . Bactrim [Sulfamethoxazole W-Trimethoprim] Shortness Of Breath  . Sulfa Antibiotics Shortness Of Breath    Family History  Problem Relation Age of Onset  . Cancer Father   . Heart disease Mother       Prior to Admission medications   Medication Sig Start Date End Date Taking? Authorizing Provider  aspirin EC 81 MG tablet Take 81 mg by mouth daily.   Yes Historical Provider, MD  BIOTIN PO Take 1 tablet by mouth daily.   Yes Historical Provider, MD  Calcium-Magnesium-Zinc [phone] MG TABS Take by mouth 2 (two) times daily.   Yes Historical Provider, MD  cholecalciferol (VITAMIN D) 1000 UNITS tablet Take 1,000 Units by mouth daily.   Yes Historical Provider, MD  HYDROcodone-acetaminophen (NORCO/VICODIN) 5-325 MG per tablet Take 2 tablets by mouth every 4 (four) hours as needed for pain.   Yes Historical Provider, MD  Multiple Vitamin (MULTIVITAMIN) tablet Take 1 tablet by mouth daily.   Yes Historical Provider, MD  promethazine (PHENERGAN) 25 MG tablet Take 25 mg by mouth every 6 (six) hours as needed for nausea.   Yes Historical Provider, MD   Physical Exam: Filed Vitals:   08/20/13 2213  BP: 149/69  Pulse: 68  Temp: 98.4 F (36.9 C)  TempSrc: Oral  Resp: 16  SpO2: 100%     General:  Well-developed and nourished.  Eyes: Anicteric no pallor.  ENT: No discharge from the ears eyes nose mouth.  Neck: No mass felt.  Cardiovascular: S1-S2 heard.  Respiratory: No rhonchi or crepitations.  Abdomen: Soft mild diffuse tenderness no guarding or rigidity. Bowel sounds present.  Skin: No rash.  Musculoskeletal: No edema.  Psychiatric: Appears normal.  Neurologic: Alert awake oriented to time place and person. Moves all extremities.  Labs on Admission:  Basic Metabolic Panel:  Recent Labs Lab 08/20/13 0400 08/21/13 0014  NA 135 133*  K 4.0 3.6  CL 101 98  CO2 25 23  GLUCOSE 115* 102*  BUN 11 10  CREATININE 1.04 0.88  CALCIUM 9.1 8.7    Liver Function Tests:  Recent Labs Lab 08/20/13 0400 08/21/13 0014  AST 21 19  ALT 12 13  ALKPHOS 49 46  BILITOT 0.3 0.5  PROT 7.1 7.0  ALBUMIN 3.8 3.7    Recent Labs Lab 08/20/13 0400 08/21/13 0014  LIPASE 27 19   No results found for this basename: AMMONIA,  in the last 168 hours CBC:  Recent Labs Lab 08/20/13 0400 08/21/13 0014  WBC 6.3 8.3  NEUTROABS 5.3 7.1  HGB 11.1* 11.3*  HCT 31.6* 32.8*  MCV 93.5 92.9  PLT 174 195   Cardiac Enzymes: No results found for this basename: CKTOTAL, CKMB, CKMBINDEX, TROPONINI,  in the last 168 hours  BNP (last 3 results) No results found for this basename: PROBNP,  in the last 8760 hours CBG: No results found for this basename: GLUCAP,  in the last 168 hours  Radiological Exams on Admission: Ct Abdomen Pelvis W Contrast  08/20/2013   *RADIOLOGY REPORT*  Clinical Data: Acute onset right lower quadrant pain, nausea and vomiting.  CT ABDOMEN AND PELVIS WITH CONTRAST  Technique:  Multidetector CT imaging of the abdomen and pelvis was performed following the standard protocol during bolus administration of intravenous contrast.  Contrast: 1 OMNIPAQUE IOHEXOL 300 MG/ML  SOLN, OMNIPAQUE IOHEXOL 300 MG/ML  SOLN  Comparison: None.  Findings: Lung bases are clear.  The liver, spleen, gallbladder, pancreas, adrenal glands, solitary right kidney, abdominal aorta, inferior vena cava, and retroperitoneal lymph nodes are unremarkable.  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  The pyloric region of the stomach is thickened and decompressed.  This may just be due to normal peristalsis but inflammatory change is not excluded. Clinical correlation for peptic ulcer disease is recommended.  The small bowel are decompressed.  Gas and stool in the colon without distension.  No free air or free fluid in the abdomen.  Pelvis:  Fluid filled mildly dilated pelvic small bowel loops could represent enteritis.  No significant wall  thickening.  Small amount of free fluid in the pelvis of nonspecific etiology, possibly inflammatory.  There appears to be surgical absence of the uterus. No abnormal adnexal masses.  The appendix is not identified. Bladder wall is not thickened.  Normal alignment of the lumbar spine.  No destructive bone lesions appreciated.  The study is technically limited due to lack of body fat.  IMPRESSION: Surgical absence of the left kidney.  There is nonspecific appearance of the pyloric region of the stomach which may just be due to normal peristalsis but inflammatory change is not excluded. There is mild prominence of fluid-filled small bowel loops in the pelvis which could represent enteritis.  Small amount of free fluid in the pelvis may be reactive.   Original Report Authenticated By: Burman Nieves, M.D.     Assessment/Plan Active Problems:   Abdominal pain   Anemia   1. Abdominal pain with nausea and vomiting - most likely cause could be viral gastroenteritis. Check KUB. Presently I am placing patient n.p.o. with IV fluids and pain relief medications.  If pain improves start diet if not consider GI consult. 2. Anemia - follow CBC. 3. Solitary kidney.    Code Status: Full code.  Family Communication: Patient's mother at the bedside.  Disposition Plan: Admit to inpatient.    Cierah Crader N. [facility] Pager [phone].  If 7PM-7AM, please contact night-coverage [email or web address] Password [facility] 08/21/2013, 2:45 AM

## 2013-08-21 NOTE — Consult Note (Signed)
I have seen and examined the pt and agree with PA-Jenning's progress note. No acute abd NGT to LIWS Will repeat KUB in AM

## 2013-08-22 ENCOUNTER — Inpatient Hospital Stay (HOSPITAL_COMMUNITY): Payer: 59

## 2013-08-22 DIAGNOSIS — R110 Nausea: Secondary | ICD-10-CM

## 2013-08-22 DIAGNOSIS — Z90710 Acquired absence of both cervix and uterus: Secondary | ICD-10-CM

## 2013-08-22 LAB — BASIC METABOLIC PANEL
CO2: 20 mEq/L (ref 19–32)
Calcium: 8.3 mg/dL — ABNORMAL LOW (ref 8.4–10.5)
Chloride: 102 mEq/L (ref 96–112)
GFR calc Af Amer: 87 mL/min — ABNORMAL LOW (ref >90)
Sodium: 136 mEq/L (ref 135–145)

## 2013-08-22 LAB — CBC
MCH: 32.3 pg (ref 26.0–34.0)
Platelets: 220 10*3/uL (ref 150–400)
RBC: 3.78 MIL/uL — ABNORMAL LOW (ref 3.87–5.11)
RDW: 12.9 % (ref 11.5–15.5)
WBC: 9 10*3/uL (ref 4.0–10.5)

## 2013-08-22 LAB — GLUCOSE, CAPILLARY
Glucose-Capillary: 101 mg/dL — ABNORMAL HIGH (ref 70–99)
Glucose-Capillary: 89 mg/dL (ref 70–99)

## 2013-08-22 MED ORDER — WHITE PETROLATUM GEL
Status: AC
  Administered 2013-08-22: 16:00:00
  Filled 2013-08-22: qty 5

## 2013-08-22 MED ORDER — POTASSIUM CHLORIDE IN NACL 40-0.9 MEQ/L-% IV SOLN
INTRAVENOUS | Status: DC
  Administered 2013-08-22 – 2013-08-23 (×3): via INTRAVENOUS
  Filled 2013-08-22 (×5): qty 1000

## 2013-08-22 MED ORDER — BIOTENE DRY MOUTH MT LIQD
15.0000 mL | Freq: Two times a day (BID) | OROMUCOSAL | Status: DC
  Administered 2013-08-24 – 2013-08-26 (×2): 15 mL via OROMUCOSAL

## 2013-08-22 MED ORDER — CHLORHEXIDINE GLUCONATE 0.12 % MT SOLN
15.0000 mL | Freq: Two times a day (BID) | OROMUCOSAL | Status: DC
  Administered 2013-08-22 – 2013-08-26 (×7): 15 mL via OROMUCOSAL
  Filled 2013-08-22 (×5): qty 15

## 2013-08-22 NOTE — Progress Notes (Signed)
[facility] PROGRESS NOTE  Jane Buckley JXB:[id number] DOB: 11-Feb-1961 DOA: 08/20/2013 PCP: Gaye Alken, MD  Assessment/Plan: 1. Abdominal pain, nausea and vomiting, with some small bowel dilatation: NG tube, sugery consult, NPO, ? Need for ex lap 2. Recent donor nephrectomy on left - monitor Cr 3. Hx of IBS, hx of abdominal pain back in 2001, no etiology  4. S/p hysterectomy     Code Status: full Family Communication: patient Disposition Plan:    Consultants:  surgery  Procedures:  NG tube  Antibiotics:    HPI/Subjective: Still with abd pain  Objective: Filed Vitals:   08/22/13 0556  BP: 113/67  Pulse: 77  Temp: 99.5 F (37.5 C)  Resp:     Intake/Output Summary (Last 24 hours) at 08/22/13 1306 Last data filed at 08/21/13 1400  Gross per 24 hour  Intake    786 ml  Output      0 ml  Net    786 ml   Filed Weights   08/20/13 2213 08/22/13 0700  Weight: 66.951 kg (147 lb 9.6 oz) 66.951 kg (147 lb 9.6 oz)    Exam:   General:  uncomfortable appearing  Cardiovascular: rrr  Respiratory: clear anterior  Abdomen: NG tube in place  Musculoskeletal: moves all 4 ext  Data Reviewed: Basic Metabolic Panel:  Recent Labs Lab 08/20/13 0400 08/21/13 0014 08/21/13 0515 08/22/13 0435  NA 135 133* 137 136  K 4.0 3.6 4.1 3.7  CL 101 98 103 102  CO2 25 23 24 20   GLUCOSE 115* 102* 93 97  BUN 11 10 11 12   CREATININE 1.04 0.88 1.03 0.89  CALCIUM 9.1 8.7 8.7 8.3*   Liver Function Tests:  Recent Labs Lab 08/20/13 0400 08/21/13 0014 08/21/13 0515  AST 21 19 23   ALT 12 13 12   ALKPHOS 49 46 42  BILITOT 0.3 0.5 0.5  PROT 7.1 7.0 6.8  ALBUMIN 3.8 3.7 3.5    Recent Labs Lab 08/20/13 0400 08/21/13 0014 08/21/13 0515  LIPASE 27 19 18    No results found for this basename: AMMONIA,  in the last 168 hours CBC:  Recent Labs Lab 08/20/13 0400 08/21/13 0014 08/21/13 0515 08/22/13 0435  WBC 6.3 8.3 7.6 9.0  NEUTROABS  5.3 7.1 6.3  --   HGB 11.1* 11.3* 12.5 12.2  HCT 31.6* 32.8* 36.9 35.3*  MCV 93.5 92.9 93.2 93.4  PLT 174 195 146* 220   Cardiac Enzymes: No results found for this basename: CKTOTAL, CKMB, CKMBINDEX, TROPONINI,  in the last 168 hours BNP (last 3 results) No results found for this basename: PROBNP,  in the last 8760 hours CBG:  Recent Labs Lab 08/21/13 1150 08/21/13 1841 08/21/13 2355 08/22/13 0610 08/22/13 1216  GLUCAP 77 86 101* 107* 93    No results found for this or any previous visit (from the past 240 hour(s)).   Studies: Dg Abd 1 View  08/22/2013   CLINICAL DATA:  Nasogastric tube placement.  EXAM: ABDOMEN - 1 VIEW  COMPARISON:  Prior today  FINDINGS: A nasogastric tube is seen with tip in the body of the stomach. Dilated small bowel loops again seen within the upper abdomen.  IMPRESSION: Nasogastric tube tip in gastric body.   Electronically Signed   By: Myles Rosenthal   On: 08/22/2013 10:49   Dg Abd 1 View  08/21/2013   *RADIOLOGY REPORT*  Clinical Data: Right lower quadrant pain  ABDOMEN - 1 VIEW  Comparison: 08/20/2013 CT  Findings: The bowel gas  pattern is non- specific without overt obstruction. Again, there is a mildly prominent small bowel loop over the pelvis measuring 3.5 cm in diameter, though air is noted within normal caliber colon. Contrast within the gallbladder is presumably secondary to vicarious excretion.  There is a surgical clip projecting over the left upper quadrant.  The hemidiaphragms are excluded from the image. Otherwise, organ outlines are normal where seen. No acute or aggressive osseous abnormality identified.  IMPRESSION: Nonspecific bowel gas pattern with a persistent mildly distended small bowel loop and no evidence for complete obstruction.   Original Report Authenticated By: Jearld Lesch, M.D.   Dg Abd 2 Views  08/22/2013   *RADIOLOGY REPORT*  Clinical Data: Abdominal pain, distention and dilated small bowel loops.  ABDOMEN - 2 VIEW   Comparison: 08/21/2013  Findings: There remain dilated jejunal loops primarily in the upper abdomen measuring up to approximately 4 cm in caliber.  Several of these loops contain air fluid levels on the upright film.  Findings remain suggestive of ileus versus partial small bowel obstruction. No evidence of free intraperitoneal air.  Some air and stool remains in the colon.  No abnormal calcifications or soft tissue abnormalities are identified.  IMPRESSION: Residual dilated jejunal loops consistent with regional ileus versus partial small bowel obstruction.   Original Report Authenticated By: Irish Lack, M.D.    Scheduled Meds: . antiseptic oral rinse  15 mL Mouth Rinse q12n4p  . chlorhexidine  15 mL Mouth Rinse BID  . pantoprazole (PROTONIX) IV  40 mg Intravenous Q12H   Continuous Infusions: . 0.9 % NaCl with KCl 40 mEq / L 100 mL/hr at 08/22/13 1049    Active Problems:   Abdominal pain   Anemia    Time spent: 35    [facility], Niklaus Mamaril  [facility] Pager [phone]. If 7PM-7AM, please contact night-coverage at [email or web address], password [facility] 08/22/2013, 1:06 PM  LOS: 2 days

## 2013-08-22 NOTE — Progress Notes (Signed)
I have seen and examined the pt and agree with PA-Dort's progress note. NGT by IR Repeat KUB in AM Encourage ambulation

## 2013-08-22 NOTE — Progress Notes (Signed)
Subjective: Pt notes no improvement.  Last night she was able to sleep with the help of morphine, but this morning she's in a lot of pain.  She continues to be nauseated.  She vomited last night around 7pm.  She's not nauseated this am.  No flatus or BM.  Abdomen bloated.  Ambulating in the room, but too uncomfortable to ambulate in the halls.    Objective: Vital signs in last 24 hours: Temp:  [98.2 F (36.8 C)-99.5 F (37.5 C)] 99.5 F (37.5 C) (08/26 0556) Pulse Rate:  [66-77] 77 (08/26 0556) Resp:  [18] 18 (08/25 2105) BP: (113-143)/(67-76) 113/67 mmHg (08/26 0556) SpO2:  [96 %-100 %] 100 % (08/26 0556) Last BM Date: 08/20/13  Intake/Output from previous day: 08/25 0701 - 08/26 0700 In: 786 [I.V.:786] Out: -  Intake/Output this shift:    PE: Gen:  Alert, NAD, pleasant Abd: Soft, moderate tenderness diffusely, tympanic with distension, few BS, abdominal scars noted   Lab Results:   Recent Labs  08/21/13 0515 08/22/13 0435  WBC 7.6 9.0  HGB 12.5 12.2  HCT 36.9 35.3*  PLT 146* 220   BMET  Recent Labs  08/21/13 0515 08/22/13 0435  NA 137 136  K 4.1 3.7  CL 103 102  CO2 24 20  GLUCOSE 93 97  BUN 11 12  CREATININE 1.03 0.89  CALCIUM 8.7 8.3*   PT/INR No results found for this basename: LABPROT, INR,  in the last 72 hours CMP     Component Value Date/Time   NA 136 08/22/2013 0435   K 3.7 08/22/2013 0435   CL 102 08/22/2013 0435   CO2 20 08/22/2013 0435   GLUCOSE 97 08/22/2013 0435   BUN 12 08/22/2013 0435   CREATININE 0.89 08/22/2013 0435   CALCIUM 8.3* 08/22/2013 0435   PROT 6.8 08/21/2013 0515   ALBUMIN 3.5 08/21/2013 0515   AST 23 08/21/2013 0515   ALT 12 08/21/2013 0515   ALKPHOS 42 08/21/2013 0515   BILITOT 0.5 08/21/2013 0515   GFRNONAA 75* 08/22/2013 0435   GFRAA 87* 08/22/2013 0435   Lipase     Component Value Date/Time   LIPASE 18 08/21/2013 0515       Studies/Results: Dg Abd 1 View  08/21/2013   *RADIOLOGY REPORT*  Clinical Data: Right  lower quadrant pain  ABDOMEN - 1 VIEW  Comparison: 08/20/2013 CT  Findings: The bowel gas pattern is non- specific without overt obstruction. Again, there is a mildly prominent small bowel loop over the pelvis measuring 3.5 cm in diameter, though air is noted within normal caliber colon. Contrast within the gallbladder is presumably secondary to vicarious excretion.  There is a surgical clip projecting over the left upper quadrant.  The hemidiaphragms are excluded from the image. Otherwise, organ outlines are normal where seen. No acute or aggressive osseous abnormality identified.  IMPRESSION: Nonspecific bowel gas pattern with a persistent mildly distended small bowel loop and no evidence for complete obstruction.   Original Report Authenticated By: Jearld Lesch, M.D.    Anti-infectives: Anti-infectives   None       Assessment/Plan 1.  Abdominal pain, nausea and vomiting, with some small bowel dilatation.  2.  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  Hx of IBS, hx of abdominal pain back in 2001, no etiology  4.  S/p hysterectomy   Plan:  1.  NG tube to be placed by IR 2.  NPO except ice chips, antiemetics, pain control 3.  Hopefully will  improve after NG placement 4.  Await bowel function and if not resolving in the next few days may need ex lap 5.  Try gas chair    LOS: 2 days    Aris Georgia 08/22/2013, 7:27 AM Pager: [phone]

## 2013-08-23 ENCOUNTER — Encounter (HOSPITAL_COMMUNITY): Admission: EM | Disposition: A | Discharge status: Home / Self Care | Payer: Self-pay | Source: Home / Self Care

## 2013-08-23 ENCOUNTER — Encounter (HOSPITAL_COMMUNITY): Payer: Self-pay | Admitting: Anesthesiology

## 2013-08-23 ENCOUNTER — Inpatient Hospital Stay (HOSPITAL_COMMUNITY): Payer: 59

## 2013-08-23 ENCOUNTER — Inpatient Hospital Stay (HOSPITAL_COMMUNITY): Payer: 59 | Admitting: Anesthesiology

## 2013-08-23 DIAGNOSIS — K565 Intestinal adhesions [bands], unspecified as to partial versus complete obstruction: Secondary | ICD-10-CM

## 2013-08-23 HISTORY — PX: LAPAROTOMY: SHX154

## 2013-08-23 LAB — CBC
MCH: 32.6 pg (ref 26.0–34.0)
MCHC: 34.2 g/dL (ref 30.0–36.0)
MCV: 95.3 fL (ref 78.0–100.0)
Platelets: 201 10*3/uL (ref 150–400)
RBC: 3.62 MIL/uL — ABNORMAL LOW (ref 3.87–5.11)
RDW: 12.9 % (ref 11.5–15.5)

## 2013-08-23 LAB — BASIC METABOLIC PANEL
Calcium: 8.5 mg/dL (ref 8.4–10.5)
Creatinine, Ser: 0.88 mg/dL (ref 0.50–1.10)
GFR calc non Af Amer: 76 mL/min — ABNORMAL LOW (ref >90)
Glucose, Bld: 82 mg/dL (ref 70–99)
Sodium: 137 mEq/L (ref 135–145)

## 2013-08-23 LAB — URINALYSIS, ROUTINE W REFLEX MICROSCOPIC
Glucose, UA: NEGATIVE mg/dL
Leukocytes, UA: NEGATIVE
Protein, ur: NEGATIVE mg/dL
pH: 6 (ref 5.0–8.0)

## 2013-08-23 LAB — URINE MICROSCOPIC-ADD ON

## 2013-08-23 LAB — SURGICAL PCR SCREEN
MRSA, PCR: NEGATIVE
Staphylococcus aureus: NEGATIVE

## 2013-08-23 SURGERY — LAPAROTOMY, EXPLORATORY
Anesthesia: General | Site: Abdomen | Wound class: Clean

## 2013-08-23 MED ORDER — PROPOFOL 10 MG/ML IV BOLUS
INTRAVENOUS | Status: DC | PRN
  Administered 2013-08-23: 170 mg via INTRAVENOUS

## 2013-08-23 MED ORDER — MORPHINE SULFATE 2 MG/ML IJ SOLN
2.0000 mg | Freq: Once | INTRAMUSCULAR | Status: AC
  Administered 2013-08-23: 2 mg via INTRAVENOUS
  Filled 2013-08-23: qty 1

## 2013-08-23 MED ORDER — MIDAZOLAM HCL 5 MG/5ML IJ SOLN
INTRAMUSCULAR | Status: DC | PRN
  Administered 2013-08-23: 2 mg via INTRAVENOUS

## 2013-08-23 MED ORDER — CEFAZOLIN SODIUM-DEXTROSE 2-3 GM-% IV SOLR
INTRAVENOUS | Status: DC | PRN
  Administered 2013-08-23: 2 g via INTRAVENOUS

## 2013-08-23 MED ORDER — PROMETHAZINE HCL 25 MG/ML IJ SOLN: 6.2500 mg | INTRAMUSCULAR | Status: DC | PRN

## 2013-08-23 MED ORDER — MIDAZOLAM HCL 2 MG/2ML IJ SOLN: 1.0000 mg | INTRAMUSCULAR | Status: DC | PRN

## 2013-08-23 MED ORDER — SUCCINYLCHOLINE CHLORIDE 20 MG/ML IJ SOLN
INTRAMUSCULAR | Status: DC | PRN
  Administered 2013-08-23: 100 mg via INTRAVENOUS

## 2013-08-23 MED ORDER — ONDANSETRON HCL 4 MG/2ML IJ SOLN
INTRAMUSCULAR | Status: DC | PRN
  Administered 2013-08-23: 4 mg via INTRAVENOUS

## 2013-08-23 MED ORDER — DEXTROSE 5 % IV SOLN
2.0000 g | INTRAVENOUS | Status: AC
  Filled 2013-08-23 (×2): qty 2

## 2013-08-23 MED ORDER — OXYCODONE HCL 5 MG PO TABS: 5.0000 mg | ORAL_TABLET | Freq: Once | ORAL | Status: DC | PRN

## 2013-08-23 MED ORDER — HYDROMORPHONE HCL PF 1 MG/ML IJ SOLN
INTRAMUSCULAR | Status: AC
  Filled 2013-08-23: qty 1

## 2013-08-23 MED ORDER — MORPHINE SULFATE 2 MG/ML IJ SOLN
2.0000 mg | INTRAMUSCULAR | Status: DC | PRN
Start: 2013-08-23 — End: 2013-08-29
  Administered 2013-08-23 – 2013-08-27 (×12): 2 mg via INTRAVENOUS
  Filled 2013-08-23 (×12): qty 1

## 2013-08-23 MED ORDER — LIDOCAINE HCL (CARDIAC) 20 MG/ML IV SOLN
INTRAVENOUS | Status: DC | PRN
  Administered 2013-08-23: 100 mg via INTRAVENOUS

## 2013-08-23 MED ORDER — KCL IN DEXTROSE-NACL 40-5-0.9 MEQ/L-%-% IV SOLN
INTRAVENOUS | Status: DC
  Administered 2013-08-23 – 2013-08-27 (×8): via INTRAVENOUS
  Administered 2013-08-28: 20 mL/h via INTRAVENOUS
  Filled 2013-08-23 (×18): qty 1000

## 2013-08-23 MED ORDER — 0.9 % SODIUM CHLORIDE (POUR BTL) OPTIME
TOPICAL | Status: DC | PRN
  Administered 2013-08-23: 1000 mL

## 2013-08-23 MED ORDER — LACTATED RINGERS IV SOLN
INTRAVENOUS | Status: DC | PRN
  Administered 2013-08-23 (×2): via INTRAVENOUS

## 2013-08-23 MED ORDER — FENTANYL CITRATE 0.05 MG/ML IJ SOLN
INTRAMUSCULAR | Status: DC | PRN
  Administered 2013-08-23: 100 ug via INTRAVENOUS
  Administered 2013-08-23: 150 ug via INTRAVENOUS
  Administered 2013-08-23: 100 ug via INTRAVENOUS

## 2013-08-23 MED ORDER — OXYCODONE HCL 5 MG/5ML PO SOLN: 5.0000 mg | Freq: Once | ORAL | Status: DC | PRN

## 2013-08-23 MED ORDER — GLYCOPYRROLATE 0.2 MG/ML IJ SOLN
INTRAMUSCULAR | Status: DC | PRN
  Administered 2013-08-23: 0.6 mg via INTRAVENOUS

## 2013-08-23 MED ORDER — ARTIFICIAL TEARS OP OINT
TOPICAL_OINTMENT | OPHTHALMIC | Status: DC | PRN
  Administered 2013-08-23: 1 via OPHTHALMIC

## 2013-08-23 MED ORDER — HYDROMORPHONE HCL PF 1 MG/ML IJ SOLN
0.2500 mg | INTRAMUSCULAR | Status: DC | PRN
  Administered 2013-08-23 (×2): 0.25 mg via INTRAVENOUS
  Administered 2013-08-23: 0.5 mg via INTRAVENOUS

## 2013-08-23 MED ORDER — ROCURONIUM BROMIDE 100 MG/10ML IV SOLN
INTRAVENOUS | Status: DC | PRN
  Administered 2013-08-23: 50 mg via INTRAVENOUS

## 2013-08-23 MED ORDER — NEOSTIGMINE METHYLSULFATE 1 MG/ML IJ SOLN
INTRAMUSCULAR | Status: DC | PRN
  Administered 2013-08-23: 4 mg via INTRAVENOUS

## 2013-08-23 MED ORDER — CEFAZOLIN SODIUM-DEXTROSE 2-3 GM-% IV SOLR
INTRAVENOUS | Status: AC
  Filled 2013-08-23: qty 50

## 2013-08-23 MED ORDER — ALBUMIN HUMAN 5 % IV SOLN
INTRAVENOUS | Status: DC | PRN
  Administered 2013-08-23: 13:00:00 via INTRAVENOUS

## 2013-08-23 MED ORDER — FENTANYL CITRATE 0.05 MG/ML IJ SOLN: 50.0000 ug | Freq: Once | INTRAMUSCULAR | Status: DC

## 2013-08-23 SURGICAL SUPPLY — 40 items
BLADE SURG ROTATE 9660 (MISCELLANEOUS) IMPLANT
CANISTER SUCTION 2500CC (MISCELLANEOUS) ×2 IMPLANT
CHLORAPREP W/TINT 26ML (MISCELLANEOUS) ×2 IMPLANT
CLOTH BEACON ORANGE TIMEOUT ST (SAFETY) ×2 IMPLANT
COVER SURGICAL LIGHT HANDLE (MISCELLANEOUS) ×2 IMPLANT
DRAPE LAPAROSCOPIC ABDOMINAL (DRAPES) ×2 IMPLANT
DRAPE UTILITY 15X26 W/TAPE STR (DRAPE) ×4 IMPLANT
DRAPE WARM FLUID 44X44 (DRAPE) ×2 IMPLANT
ELECT BLADE 6.5 EXT (BLADE) IMPLANT
ELECT CAUTERY BLADE 6.4 (BLADE) ×2 IMPLANT
ELECT REM PT RETURN 9FT ADLT (ELECTROSURGICAL) ×2
ELECTRODE REM PT RTRN 9FT ADLT (ELECTROSURGICAL) ×1 IMPLANT
GLOVE BIO SURGEON STRL SZ7.5 (GLOVE) ×3 IMPLANT
GLOVE BIOGEL PI IND STRL 7.5 (GLOVE) IMPLANT
GLOVE BIOGEL PI IND STRL 8 (GLOVE) ×1 IMPLANT
GLOVE BIOGEL PI INDICATOR 7.5 (GLOVE) ×1
GLOVE BIOGEL PI INDICATOR 8 (GLOVE) ×1
GOWN STRL NON-REIN LRG LVL3 (GOWN DISPOSABLE) ×4 IMPLANT
GOWN STRL REIN XL XLG (GOWN DISPOSABLE) ×2 IMPLANT
KIT BASIN OR (CUSTOM PROCEDURE TRAY) ×2 IMPLANT
KIT ROOM TURNOVER OR (KITS) ×2 IMPLANT
LIGASURE IMPACT 36 18CM CVD LR (INSTRUMENTS) IMPLANT
NS IRRIG 1000ML POUR BTL (IV SOLUTION) ×4 IMPLANT
PACK GENERAL/GYN (CUSTOM PROCEDURE TRAY) ×2 IMPLANT
PAD ARMBOARD 7.5X6 YLW CONV (MISCELLANEOUS) ×4 IMPLANT
SPECIMEN JAR LARGE (MISCELLANEOUS) IMPLANT
SPONGE LAP 18X18 X RAY DECT (DISPOSABLE) IMPLANT
STAPLER VISISTAT 35W (STAPLE) ×2 IMPLANT
SUCTION POOLE TIP (SUCTIONS) ×2 IMPLANT
SUT PDS AB 1 CT  36 (SUTURE) ×2
SUT PDS AB 1 CT 36 (SUTURE) IMPLANT
SUT PDS AB 1 TP1 96 (SUTURE) ×4 IMPLANT
SUT SILK 2 0 SH CR/8 (SUTURE) ×2 IMPLANT
SUT SILK 2 0 TIES 10X30 (SUTURE) ×2 IMPLANT
SUT SILK 3 0 SH CR/8 (SUTURE) ×2 IMPLANT
SUT SILK 3 0 TIES 10X30 (SUTURE) ×2 IMPLANT
TOWEL OR 17X26 10 PK STRL BLUE (TOWEL DISPOSABLE) ×2 IMPLANT
TRAY FOLEY CATH 14FRSI W/METER (CATHETERS) IMPLANT
WATER STERILE IRR 1000ML POUR (IV SOLUTION) IMPLANT
YANKAUER SUCT BULB TIP NO VENT (SUCTIONS) IMPLANT

## 2013-08-23 NOTE — Preoperative (Signed)
Beta Blockers   Reason not to administer Beta Blockers:Not Applicable 

## 2013-08-23 NOTE — Anesthesia Preprocedure Evaluation (Signed)
Anesthesia Evaluation  Patient identified by MRN, date of birth, ID band Patient awake    Reviewed: Allergy & Precautions, H&P , NPO status , Patient's Chart, lab work & pertinent test results  Airway Mallampati: I TM Distance: >3 FB Neck ROM: Full    Dental   Pulmonary  breath sounds clear to auscultation        Cardiovascular Rhythm:Regular Rate:Normal     Neuro/Psych    GI/Hepatic SBO   Endo/Other    Renal/GU      Musculoskeletal   Abdominal (+)  Abdomen: tender.    Peds  Hematology   Anesthesia Other Findings   Reproductive/Obstetrics                           Anesthesia Physical Anesthesia Plan  ASA: II  Anesthesia Plan: General   Post-op Pain Management:    Induction: Intravenous, Rapid sequence and Cricoid pressure planned  Airway Management Planned: Oral ETT  Additional Equipment:   Intra-op Plan:   Post-operative Plan: Extubation in OR  Informed Consent: I have reviewed the patients History and Physical, chart, labs and discussed the procedure including the risks, benefits and alternatives for the proposed anesthesia with the patient or authorized representative who has indicated his/her understanding and acceptance.     Plan Discussed with: CRNA and Surgeon  Anesthesia Plan Comments:         Anesthesia Quick Evaluation

## 2013-08-23 NOTE — Transfer of Care (Signed)
Immediate Anesthesia Transfer of Care Note  Patient: Jane Buckley  Procedure(s) Performed: Procedure(s): EXPLORATORY LAPAROTOMY, LYSIS OF ADHESIONS (N/A)  Patient Location: PACU  Anesthesia Type:General  Level of Consciousness: awake, alert  and oriented  Airway & Oxygen Therapy: Patient Spontanous Breathing and Patient connected to face mask oxygen  Post-op Assessment: Report given to PACU RN  Post vital signs: Reviewed and stable  Complications: No apparent anesthesia complications

## 2013-08-23 NOTE — Anesthesia Postprocedure Evaluation (Signed)
  Anesthesia Post-op Note  Patient: Jane Buckley  Procedure(s) Performed: Procedure(s): EXPLORATORY LAPAROTOMY, LYSIS OF ADHESIONS (N/A)  Patient Location: PACU  Anesthesia Type:General  Level of Consciousness: awake and alert   Airway and Oxygen Therapy: Patient Spontanous Breathing  Post-op Pain: mild  Post-op Assessment: Post-op Vital signs reviewed, Patient's Cardiovascular Status Stable, Respiratory Function Stable, Patent Airway, No signs of Nausea or vomiting and Pain level controlled  Post-op Vital Signs: stable  Complications: No apparent anesthesia complications

## 2013-08-23 NOTE — Progress Notes (Signed)
Subjective: out of NG tube, pt notes some intermittent improvement in her abdominal pain/distension.  She currently states it is bloated, very tight and painful.  Intermittent nausea, but overall improved with NG tube.  Denies N/V currently.  Tolerate a few ice chips.  Up OOB and to the chair.  No flatus or BM.  C/o frequent urination and urgency she has had 3 accidents in the bed due to urinary urgency.  Objective: Vital signs in last 24 hours: Temp:  [97.9 F (36.6 C)-99.8 F (37.7 C)] 98.6 F (37 C) (08/27 0520) Pulse Rate:  [77-81] 81 (08/27 0520) Resp:  [16-19] 16 (08/27 0520) BP: (130-141)/(68-78) 130/68 mmHg (08/27 0520) SpO2:  [98 %-100 %] 100 % (08/27 0520) Last BM Date: 08/20/13  Intake/Output from previous day: 08/26 0701 - 08/27 0700 In: 1974 [I.V.:1974] Out: 1400 [Urine:900; Emesis/NG output:500] Intake/Output this shift:    PE: Gen:  Alert, NAD, pleasant Abd: Soft, subjective guarding, significant TTP >lower quadrants, +BS, no HSM,  abdominal scars noted   Lab Results:   Recent Labs  08/21/13 0515 08/22/13 0435  WBC 7.6 9.0  HGB 12.5 12.2  HCT 36.9 35.3*  PLT 146* 220   BMET  Recent Labs  08/21/13 0515 08/22/13 0435  NA 137 136  K 4.1 3.7  CL 103 102  CO2 24 20  GLUCOSE 93 97  BUN 11 12  CREATININE 1.03 0.89  CALCIUM 8.7 8.3*   PT/INR No results found for this basename: LABPROT, INR,  in the last 72 hours CMP     Component Value Date/Time   NA 136 08/22/2013 0435   K 3.7 08/22/2013 0435   CL 102 08/22/2013 0435   CO2 20 08/22/2013 0435   GLUCOSE 97 08/22/2013 0435   BUN 12 08/22/2013 0435   CREATININE 0.89 08/22/2013 0435   CALCIUM 8.3* 08/22/2013 0435   PROT 6.8 08/21/2013 0515   ALBUMIN 3.5 08/21/2013 0515   AST 23 08/21/2013 0515   ALT 12 08/21/2013 0515   ALKPHOS 42 08/21/2013 0515   BILITOT 0.5 08/21/2013 0515   GFRNONAA 75* 08/22/2013 0435   GFRAA 87* 08/22/2013 0435   Lipase     Component Value Date/Time   LIPASE 18  08/21/2013 0515       Studies/Results: Dg Abd 1 View  08/22/2013   CLINICAL DATA:  Nasogastric tube placement.  EXAM: ABDOMEN - 1 VIEW  COMPARISON:  Prior today  FINDINGS: A nasogastric tube is seen with tip in the body of the stomach. Dilated small bowel loops again seen within the upper abdomen.  IMPRESSION: Nasogastric tube tip in gastric body.   Electronically Signed   By: Myles Rosenthal   On: 08/22/2013 10:49   Dg Abd 2 Views  08/22/2013   *RADIOLOGY REPORT*  Clinical Data: Abdominal pain, distention and dilated small bowel loops.  ABDOMEN - 2 VIEW  Comparison: 08/21/2013  Findings: There remain dilated jejunal loops primarily in the upper abdomen measuring up to approximately 4 cm in caliber.  Several of these loops contain air fluid levels on the upright film.  Findings remain suggestive of ileus versus partial small bowel obstruction. No evidence of free intraperitoneal air.  Some air and stool remains in the colon.  No abnormal calcifications or soft tissue abnormalities are identified.  IMPRESSION: Residual dilated jejunal loops consistent with regional ileus versus partial small bowel obstruction.   Original Report Authenticated By: Irish Lack, M.D.   Dg Naso G Tube Plc W/fl-no Rad  08/22/2013  CLINICAL DATA: Nursing unable to place yesterday   NASO G TUBE PLACEMENT WITH FLUORO  Fluoroscopy was utilized by the requesting physician.  No radiographic  interpretation.     Anti-infectives: Anti-infectives   None       Assessment/Plan 1. Abdominal pain, nausea and vomiting, with some small bowel dilatation.  2. 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 Hx of IBS, hx of abdominal pain back in 2001, no etiology  4. S/p hysterectomy and tubal ligation  Plan:  1. NG tube in place, KUB report pending, but appears to be worsened dilation and clinically has no improvement since NG placed 2. NPO except ice chips, antiemetics, pain control  3. Given no improvement needs ex lap, possible  bowel resection, possible ostomy 4. Hold lovenox for OR, start after surgery 5. SCD's, IS, and ambulation as tolerated     LOS: 3 days    DORT, Teliah [place] 08/23/2013, 7:41 AM Pager: [phone]

## 2013-08-23 NOTE — Op Note (Signed)
Pre Operative Diagnosis:  Small bowel obstruction  Post Operative Diagnosis: small bowel obstruction due to adhesions  Procedure:exploratory laparotomy, lysis of adhesions  Surgeon: Dr. Axel Filler  Assistant: Dr. [name]  Anesthesia:  GETA   EBL: 5 cc  Complications: none  Counts: reported as correct x 2  Findings:  The patient had a omental band to the pouch. This was lysed. Patient also had 2 isolated advanced to the right ovum. These were lysed without issues. D. Most distal bands for approximately 2 inches from the cecum.  Indications for procedure:  The patient is a 52 year old female with previous history of a hysterectomy as well as a donor nephrectomy. Patient had approximately a 48 hour history of small bowel obstruction prior to her operation. Secondary to no improvement of her clinical or physical exam she is taken back for exploratory laparotomy.  Details of the procedure:The patient was taken back to the operating room. The patient was placed in supine position with bilateral SCDs in place. After appropriate anitbiotics were confirmed, a time-out was confirmed and all facts were verified.  A 10 blade was used to incise the midline. Bovie cautery was used to maintain hemostasis dissection was taken down to the midline fascia. This was incised the peritoneum was bluntly entered. There was large amount of serous fluid from the abdomen. This was irrigated out. The small bowel was eviscerated and seen to be very dilated. There was one omental band from the omentum down to the pelvis near the midline that was lysed. There was also a second and third adhesive band to the right ovum, this was also lysed. This is approximately to 3 inches from the ileocecal valve.  At this time the bowel was then replaced into the abdomen. The omentum was brought over the midline and the small bowel. The fascia was reapproximated using #1 PDS in running standard fashion.  The patient was taken to the  recovery room in stable condition.

## 2013-08-23 NOTE — Progress Notes (Signed)
I have seen and examined the pt and agree with PA-Dort's progress note. Pt con't with abd pain.   KUB shows more distended loops of small bowel. NGT in but little outoput Secondary to the little progress and the increased distention of her SB I do not believe that this is going to resolve on its own.  I discussed with her we should proceed to the OR for an exlap, and she agreed.  We discussed the risks and benefits of the procedure to include but not limited to: infection, bleeding, bowel resection, possible ostomy, and any other necessary procedure.  She voiced understanding and agreed to procede.

## 2013-08-24 LAB — GLUCOSE, CAPILLARY
Glucose-Capillary: 117 mg/dL — ABNORMAL HIGH (ref 70–99)
Glucose-Capillary: 127 mg/dL — ABNORMAL HIGH (ref 70–99)
Glucose-Capillary: 120 mg/dL — ABNORMAL HIGH (ref 70–99)

## 2013-08-24 MED ORDER — ENOXAPARIN SODIUM 40 MG/0.4ML ~~LOC~~ SOLN
40.0000 mg | SUBCUTANEOUS | Status: DC
  Administered 2013-08-24 – 2013-08-29 (×6): 40 mg via SUBCUTANEOUS
  Filled 2013-08-24 (×6): qty 0.4

## 2013-08-24 MED ORDER — WHITE PETROLATUM GEL
Status: AC
  Administered 2013-08-24: 0.2
  Filled 2013-08-24: qty 5

## 2013-08-24 NOTE — Progress Notes (Signed)
Pt has ambulated in hall x 4 today, tolerated well. Passing gas. No nausea and vomitting, no increase in pain. NG connected to LWS at this time.

## 2013-08-24 NOTE — Progress Notes (Signed)
1 Day Post-Op  Subjective: Pt's pain is substantially different today, more sore than pain.  Pt wants foley out.  She's thirsty and wants to start drinking.  Pt unsure if passing flatus, but hearing bowels move.    Objective: Vital signs in last 24 hours: Temp:  [98 F (36.7 C)-100 F (37.8 C)] 100 F (37.8 C) (08/28 0631) Pulse Rate:  [83-130] 102 (08/28 0631) Resp:  [11-18] 18 (08/28 0631) BP: (123-150)/(66-77) 123/75 mmHg (08/28 0631) SpO2:  [96 %-100 %] 97 % (08/28 0631) Last BM Date: 08/20/13  Intake/Output from previous day: 08/27 0701 - 08/28 0700 In: 2431.3 [I.V.:2181.3; IV Piggyback:250] Out: 1400 [Urine:1350; Blood:50] Intake/Output this shift:    PE: Gen:  Alert, NAD, pleasant Abd: Soft, ND, mild tenderness in lower abdomen and over incision, +BS, no HSM, dressing intact with dried sanguinous drainage   Lab Results:   Recent Labs  08/22/13 0435 08/23/13 0600  WBC 9.0 5.2  HGB 12.2 11.8*  HCT 35.3* 34.5*  PLT 220 201   BMET  Recent Labs  08/22/13 0435 08/23/13 0600  NA 136 137  K 3.7 4.4  CL 102 104  CO2 20 19  GLUCOSE 97 82  BUN 12 13  CREATININE 0.89 0.88  CALCIUM 8.3* 8.5   PT/INR No results found for this basename: LABPROT, INR,  in the last 72 hours CMP     Component Value Date/Time   NA 137 08/23/2013 0600   K 4.4 08/23/2013 0600   CL 104 08/23/2013 0600   CO2 19 08/23/2013 0600   GLUCOSE 82 08/23/2013 0600   BUN 13 08/23/2013 0600   CREATININE 0.88 08/23/2013 0600   CALCIUM 8.5 08/23/2013 0600   PROT 6.8 08/21/2013 0515   ALBUMIN 3.5 08/21/2013 0515   AST 23 08/21/2013 0515   ALT 12 08/21/2013 0515   ALKPHOS 42 08/21/2013 0515   BILITOT 0.5 08/21/2013 0515   GFRNONAA 76* 08/23/2013 0600   GFRAA 88* 08/23/2013 0600   Lipase     Component Value Date/Time   LIPASE 18 08/21/2013 0515       Studies/Results: Dg Abd 1 View  08/22/2013   CLINICAL DATA:  Nasogastric tube placement.  EXAM: ABDOMEN - 1 VIEW  COMPARISON:  Prior today   FINDINGS: A nasogastric tube is seen with tip in the body of the stomach. Dilated small bowel loops again seen within the upper abdomen.  IMPRESSION: Nasogastric tube tip in gastric body.   Electronically Signed   By: Myles Rosenthal   On: 08/22/2013 10:49   Dg Abd 2 Views  08/22/2013   *RADIOLOGY REPORT*  Clinical Data: Abdominal pain, distention and dilated small bowel loops.  ABDOMEN - 2 VIEW  Comparison: 08/21/2013  Findings: There remain dilated jejunal loops primarily in the upper abdomen measuring up to approximately 4 cm in caliber.  Several of these loops contain air fluid levels on the upright film.  Findings remain suggestive of ileus versus partial small bowel obstruction. No evidence of free intraperitoneal air.  Some air and stool remains in the colon.  No abnormal calcifications or soft tissue abnormalities are identified.  IMPRESSION: Residual dilated jejunal loops consistent with regional ileus versus partial small bowel obstruction.   Original Report Authenticated By: Irish Lack, M.D.   Dg Abd Portable 1v  08/23/2013   *RADIOLOGY REPORT*  Clinical Data: Abdominal pain, small bowel obstruction  PORTABLE ABDOMEN - 1 VIEW  Comparison: 08/22/2013  Findings: Persistent small bowel dilatation consistent with small bowel obstruction.  Minimal increase in size of the largest loops in left upper quadrant since previous exam. Minimal stool and gas within colon. No definite bowel wall thickening or free intraperitoneal air. Nasogastric tube coiled in gastric fundus. Visualized lung bases clear. Stable pelvic phleboliths. Osseous structures unremarkable.  IMPRESSION: Persistent small bowel obstruction.   Original Report Authenticated By: Ulyses Southward, M.D.   Dg Naso G Tube Plc W/fl-no Rad  08/22/2013   CLINICAL DATA: Nursing unable to place yesterday   NASO G TUBE PLACEMENT WITH FLUORO  Fluoroscopy was utilized by the requesting physician.  No radiographic  interpretation.      Anti-infectives: Anti-infectives   Start     Dose/Rate Route Frequency Ordered Stop   08/23/13 0830  cefoTEtan (CEFOTAN) 2 g in dextrose 5 % 50 mL IVPB     2 g 100 mL/hr over 30 Minutes Intravenous On call to O.R. 08/23/13 9604 08/24/13 0559       Assessment/Plan 1. SBO 2* adhesions POD #1 s/p Ex lap, LOA 2. 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 Hx of IBS, hx of abdominal pain back in 2001, no etiology  4. S/p hysterectomy and tubal ligation   Plan:  1. Clamp NG tube for 6 hours check residuals, NG tube may be able to come out today, if not leave 24 hours and try tomorrow, may be able to start clears today 2. Allow sips of clear liquids (48ml)/hour for 6 hours during clamping trail 3. Await bowel function prior to advancing diet further 4. Start lovenox 1000 5. SCD's, IS, and ambulation     LOS: 4 days    Aris Georgia 08/24/2013, 7:23 AM Pager: [phone]

## 2013-08-24 NOTE — Progress Notes (Signed)
I have seen and examined the pt and agree with PA-Dort's progress note. Pt feeling better today. Clamp NGT Awaiting bowel function Ambulate

## 2013-08-25 ENCOUNTER — Encounter (HOSPITAL_COMMUNITY): Payer: Self-pay | Admitting: General Surgery

## 2013-08-25 LAB — GLUCOSE, CAPILLARY: Glucose-Capillary: 104 mg/dL — ABNORMAL HIGH (ref 70–99)

## 2013-08-25 MED ORDER — PHENOL 1.4 % MT LIQD: 1.0000 | OROMUCOSAL | Status: DC | PRN

## 2013-08-25 MED ORDER — MENTHOL 3 MG MT LOZG: 1.0000 | LOZENGE | OROMUCOSAL | Status: DC | PRN

## 2013-08-25 MED ORDER — HYDROCODONE-ACETAMINOPHEN 5-325 MG PO TABS
1.0000 | ORAL_TABLET | ORAL | Status: DC | PRN
  Administered 2013-08-26 – 2013-08-28 (×5): 2 via ORAL
  Administered 2013-08-28 (×2): 1 via ORAL
  Administered 2013-08-29 (×2): 2 via ORAL
  Filled 2013-08-25: qty 1
  Filled 2013-08-25 (×5): qty 2
  Filled 2013-08-25: qty 1
  Filled 2013-08-25 (×3): qty 2

## 2013-08-25 NOTE — Progress Notes (Signed)
I have seen and examined the pt and agree with PA-Dort's progress note. Con't with clears Con't ambulate Home in 1-2days

## 2013-08-25 NOTE — Progress Notes (Signed)
2 Days Post-Op  Subjective: Pt had a rough night.  C/O trouble breathing in her throat since NG removed.  She was placed on oxygen.  She was a bit nauseated last night and is afraid to eat.  Pt having flatus, no BM yet.  Abdominal pain a bit less today, but pain meds aren't sufficiently working.  Objective: Vital signs in last 24 hours: Temp:  [97.8 F (36.6 C)-100.5 F (38.1 C)] 98.8 F (37.1 C) (08/29 0507) Pulse Rate:  [105-136] 105 (08/29 0507) Resp:  [16-20] 16 (08/29 0507) BP: (111-142)/(32-83) 142/83 mmHg (08/29 0507) SpO2:  [98 %-100 %] 98 % (08/29 0507) Last BM Date: 08/20/13  Intake/Output from previous day: 08/28 0701 - 08/29 0700 In: 1490 [P.O.:240; I.V.:1250] Out: 1220 [Urine:1100; Emesis/NG output:120] Intake/Output this shift:    PE: Gen:  Alert, NAD, pleasant Card:  RRR, no M/G/R heard Pulm:  CTA, no W/R/R Abd: Soft, ND, moderate tenderness to palp, few BS, no HSM, midline incision C/D/I   Lab Results:   Recent Labs  08/23/13 0600  WBC 5.2  HGB 11.8*  HCT 34.5*  PLT 201   BMET  Recent Labs  08/23/13 0600  NA 137  K 4.4  CL 104  CO2 19  GLUCOSE 82  BUN 13  CREATININE 0.88  CALCIUM 8.5   PT/INR No results found for this basename: LABPROT, INR,  in the last 72 hours CMP     Component Value Date/Time   NA 137 08/23/2013 0600   K 4.4 08/23/2013 0600   CL 104 08/23/2013 0600   CO2 19 08/23/2013 0600   GLUCOSE 82 08/23/2013 0600   BUN 13 08/23/2013 0600   CREATININE 0.88 08/23/2013 0600   CALCIUM 8.5 08/23/2013 0600   PROT 6.8 08/21/2013 0515   ALBUMIN 3.5 08/21/2013 0515   AST 23 08/21/2013 0515   ALT 12 08/21/2013 0515   ALKPHOS 42 08/21/2013 0515   BILITOT 0.5 08/21/2013 0515   GFRNONAA 76* 08/23/2013 0600   GFRAA 88* 08/23/2013 0600   Lipase     Component Value Date/Time   LIPASE 18 08/21/2013 0515       Studies/Results: No results found.  Anti-infectives: Anti-infectives   Start     Dose/Rate Route Frequency Ordered Stop   08/23/13 0830  cefoTEtan (CEFOTAN) 2 g in dextrose 5 % 50 mL IVPB     2 g 100 mL/hr over 30 Minutes Intravenous On call to O.R. 08/23/13 1610 08/24/13 0559       Assessment/Plan 1. SBO 2* adhesions POD #2 s/p Ex lap, LOA  2. 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 Hx of IBS, hx of abdominal pain back in 2001, no etiology  4. S/p hysterectomy and tubal ligation   Plan:  1. D/Ced NG yesterday, tolerating clears, does not want to advance today due to nausea yesterday and anorexia 2. Await bowel function prior to advancing diet further   3. SCD's and lovenox  4. IS, and ambulation 5. Will try to advance at dinner time to fulls 6. Add norco, ultram not helping sufficiently with pain control, encourage orals vs IV pain meds     LOS: 5 days    DORT, Sarely Stracener 08/25/2013, 8:13 AM Pager: [phone]

## 2013-08-26 DIAGNOSIS — Z9889 Other specified postprocedural states: Secondary | ICD-10-CM

## 2013-08-26 LAB — GLUCOSE, CAPILLARY: Glucose-Capillary: 99 mg/dL (ref 70–99)

## 2013-08-26 NOTE — Progress Notes (Signed)
Patient ID: Jane Buckley, female   DOB: 28-Dec-1961, 52 y.o.   MRN: [id number] [facility] Surgery Progress Note:   3 Days Post-Op  Subjective: Mental status is clear.  Minimal passage of flatus Objective: Vital signs in last 24 hours: Temp:  [97.6 F (36.4 C)-98.5 F (36.9 C)] 98.5 F (36.9 C) (08/30 0558) Pulse Rate:  [80-85] 85 (08/30 0558) Resp:  [18] 18 (08/30 0558) BP: (130-142)/(69-72) 130/69 mmHg (08/30 0558) SpO2:  [97 %-100 %] 97 % (08/30 0558)  Intake/Output from previous day: 08/29 0701 - 08/30 0700 In: 1250 [I.V.:1250] Out: 1500 [Urine:1500] Intake/Output this shift:    Physical Exam: Work of breathing is normal.  Using IS.  Abdomen is sore;  No real flatus yet.  Walking and rocking in chair.    Lab Results:  Results for orders placed during the hospital encounter of 08/20/13 (from the past 48 hour(s))  GLUCOSE, CAPILLARY     Status: Abnormal   Collection Time    08/24/13 12:05 PM      Result Value Range   Glucose-Capillary 127 (*) 70 - 99 mg/dL   Comment 1 Notify RN    GLUCOSE, CAPILLARY     Status: Abnormal   Collection Time    08/24/13  5:48 PM      Result Value Range   Glucose-Capillary 120 (*) 70 - 99 mg/dL   Comment 1 Notify RN    GLUCOSE, CAPILLARY     Status: Abnormal   Collection Time    08/24/13 11:42 PM      Result Value Range   Glucose-Capillary 133 (*) 70 - 99 mg/dL  GLUCOSE, CAPILLARY     Status: Abnormal   Collection Time    08/25/13  6:15 AM      Result Value Range   Glucose-Capillary 147 (*) 70 - 99 mg/dL  GLUCOSE, CAPILLARY     Status: Abnormal   Collection Time    08/25/13 11:55 AM      Result Value Range   Glucose-Capillary 100 (*) 70 - 99 mg/dL   Comment 1 Notify RN    GLUCOSE, CAPILLARY     Status: Abnormal   Collection Time    08/25/13  6:20 PM      Result Value Range   Glucose-Capillary 104 (*) 70 - 99 mg/dL   Comment 1 Notify RN    GLUCOSE, CAPILLARY     Status: Abnormal   Collection Time    08/26/13  5:57 AM       Result Value Range   Glucose-Capillary 102 (*) 70 - 99 mg/dL    Radiology/Results: No results found.  Anti-infectives: Anti-infectives   Start     Dose/Rate Route Frequency Ordered Stop   08/23/13 0830  cefoTEtan (CEFOTAN) 2 g in dextrose 5 % 50 mL IVPB     2 g 100 mL/hr over 30 Minutes Intravenous On call to O.R. 08/23/13 0817 08/24/13 0559      Assessment/Plan: Problem List: Patient Active Problem List   Diagnosis Date Noted  . S/P laparotomy for adhesiolysis for SBO 08/26/2013  . Abdominal pain 08/21/2013  . Anemia 08/21/2013  . S/P hysterectomy with oophorectomy 02/07/2013  . Physical examination of potential organ donor 02/07/2013    Making slow progress with resolution of ileus post elap for SBO with 2 band adhesiolysis.  Slow improvement.   3 Days Post-Op    LOS: 6 days   Matt B. Daphine Deutscher, MD, [facility] [phone] beeper [phone]  08/26/2013 7:57 AM

## 2013-08-27 LAB — GLUCOSE, CAPILLARY: Glucose-Capillary: 93 mg/dL (ref 70–99)

## 2013-08-27 NOTE — Progress Notes (Signed)
Patient ID: Jane Buckley, female   DOB: 11-Mar-1961, 52 y.o.   MRN: [id number] [facility] Surgery Progress Note:   4 Days Post-Op  Subjective: Mental status is clear.  Appears tearful.  No complaints except that her BMs were watery this am.  Still on clears Objective: Vital signs in last 24 hours: Temp:  [97.8 F (36.6 C)-98.2 F (36.8 C)] 98.2 F (36.8 C) (08/31 0646) Pulse Rate:  [77-82] 77 (08/31 0646) Resp:  [18] 18 (08/31 0646) BP: (124-135)/(73-75) 124/75 mmHg (08/31 0646) SpO2:  [98 %-99 %] 99 % (08/31 0646) Weight:  [147 lb 9.6 oz (66.951 kg)] 147 lb 9.6 oz (66.951 kg) (08/30 1555)  Intake/Output from previous day: 08/30 0701 - 08/31 0700 In: 2210 [P.O.:960; I.V.:1250] Out: 1602 [Urine:1600; Stool:2] Intake/Output this shift:    Physical Exam: Work of breathing is normal.  Expected degree of abdominal pain  Lab Results:  Results for orders placed during the hospital encounter of 08/20/13 (from the past 48 hour(s))  GLUCOSE, CAPILLARY     Status: Abnormal   Collection Time    08/25/13 11:55 AM      Result Value Range   Glucose-Capillary 100 (*) 70 - 99 mg/dL   Comment 1 Notify RN    GLUCOSE, CAPILLARY     Status: Abnormal   Collection Time    08/25/13  6:20 PM      Result Value Range   Glucose-Capillary 104 (*) 70 - 99 mg/dL   Comment 1 Notify RN    GLUCOSE, CAPILLARY     Status: Abnormal   Collection Time    08/26/13  5:57 AM      Result Value Range   Glucose-Capillary 102 (*) 70 - 99 mg/dL  GLUCOSE, CAPILLARY     Status: None   Collection Time    08/26/13  5:57 PM      Result Value Range   Glucose-Capillary 99  70 - 99 mg/dL   Comment 1 Notify RN    GLUCOSE, CAPILLARY     Status: None   Collection Time    08/27/13 12:53 AM      Result Value Range   Glucose-Capillary 93  70 - 99 mg/dL  GLUCOSE, CAPILLARY     Status: Abnormal   Collection Time    08/27/13  6:57 AM      Result Value Range   Glucose-Capillary 101 (*) 70 - 99 mg/dL     Radiology/Results: No results found.  Anti-infectives: Anti-infectives   Start     Dose/Rate Route Frequency Ordered Stop   08/23/13 0830  cefoTEtan (CEFOTAN) 2 g in dextrose 5 % 50 mL IVPB     2 g 100 mL/hr over 30 Minutes Intravenous On call to O.R. 08/23/13 0817 08/24/13 0559      Assessment/Plan: Problem List: Patient Active Problem List   Diagnosis Date Noted  . S/P laparotomy for adhesiolysis for SBO 08/26/2013  . Abdominal pain 08/21/2013  . Anemia 08/21/2013  . S/P hysterectomy with oophorectomy 02/07/2013  . Physical examination of potential organ donor 02/07/2013    Will advance diet as tolerated.   Hopeful discharge tomorrow.   4 Days Post-Op    LOS: 7 days   Matt B. Daphine Deutscher, MD, [facility] [phone] beeper [phone]  08/27/2013 8:29 AM

## 2013-08-28 LAB — BASIC METABOLIC PANEL
BUN: 5 mg/dL — ABNORMAL LOW (ref 6–23)
CO2: 26 mEq/L (ref 19–32)
Chloride: 100 mEq/L (ref 96–112)
Creatinine, Ser: 0.87 mg/dL (ref 0.50–1.10)
Glucose, Bld: 100 mg/dL — ABNORMAL HIGH (ref 70–99)

## 2013-08-28 LAB — CBC
HCT: 33 % — ABNORMAL LOW (ref 36.0–46.0)
MCH: 32 pg (ref 26.0–34.0)
MCHC: 33.9 g/dL (ref 30.0–36.0)
MCV: 94.3 fL (ref 78.0–100.0)
RDW: 12.7 % (ref 11.5–15.5)

## 2013-08-28 MED ORDER — KCL IN DEXTROSE-NACL 20-5-0.45 MEQ/L-%-% IV SOLN
INTRAVENOUS | Status: DC
  Administered 2013-08-28: 20 mL/h via INTRAVENOUS
  Filled 2013-08-28 (×2): qty 1000

## 2013-08-28 MED ORDER — PANTOPRAZOLE SODIUM 40 MG PO TBEC
40.0000 mg | DELAYED_RELEASE_TABLET | Freq: Every day | ORAL | Status: DC
  Administered 2013-08-29: 40 mg via ORAL
  Filled 2013-08-28: qty 1

## 2013-08-28 NOTE — Progress Notes (Signed)
Patient ID: Jane Buckley, female   DOB: 1961/03/15, 52 y.o.   MRN: [id number] [facility] Surgery Progress Note:   5 Days Post-Op  Subjective: Mental status is clear.  Sitting up in chair Objective: Vital signs in last 24 hours: Temp:  [98.2 F (36.8 C)-98.4 F (36.9 C)] 98.4 F (36.9 C) (09/01 0500) Pulse Rate:  [76-105] 76 (09/01 0500) Resp:  [17-18] 17 (09/01 0500) BP: (118-149)/(64-84) 118/69 mmHg (09/01 0500) SpO2:  [99 %] 99 % (09/01 0500)  Intake/Output from previous day: 08/31 0701 - 09/01 0700 In: 4970 [P.O.:480; I.V.:4490] Out: -  Intake/Output this shift:    Physical Exam: Work of breathing is normal.  Passing flatus.  Incision covered.  Just started regular diet and she is not ready for discharge.    Lab Results:  Results for orders placed during the hospital encounter of 08/20/13 (from the past 48 hour(s))  GLUCOSE, CAPILLARY     Status: None   Collection Time    08/26/13  5:57 PM      Result Value Range   Glucose-Capillary 99  70 - 99 mg/dL   Comment 1 Notify RN    GLUCOSE, CAPILLARY     Status: None   Collection Time    08/27/13 12:53 AM      Result Value Range   Glucose-Capillary 93  70 - 99 mg/dL  GLUCOSE, CAPILLARY     Status: Abnormal   Collection Time    08/27/13  6:57 AM      Result Value Range   Glucose-Capillary 101 (*) 70 - 99 mg/dL  GLUCOSE, CAPILLARY     Status: Abnormal   Collection Time    08/27/13  5:03 PM      Result Value Range   Glucose-Capillary 117 (*) 70 - 99 mg/dL   Comment 1 Notify RN      Radiology/Results: No results found.  Anti-infectives: Anti-infectives   Start     Dose/Rate Route Frequency Ordered Stop   08/23/13 0830  cefoTEtan (CEFOTAN) 2 g in dextrose 5 % 50 mL IVPB     2 g 100 mL/hr over 30 Minutes Intravenous On call to O.R. 08/23/13 0817 08/24/13 0559      Assessment/Plan: Problem List: Patient Active Problem List   Diagnosis Date Noted  . S/P laparotomy for adhesiolysis for SBO 08/26/2013  .  Abdominal pain 08/21/2013  . Anemia 08/21/2013  . S/P hysterectomy with oophorectomy 02/07/2013  . Physical examination of potential organ donor 02/07/2013    Doing fairly well.  Will decrease IV rate and stop CBGs.  Will check lab in am before anticipated discharge.   5 Days Post-Op    LOS: 8 days   Matt B. Daphine Deutscher, MD, [facility] [phone] beeper [phone]  08/28/2013 9:25 AM

## 2013-08-29 MED ORDER — HYDROCODONE-ACETAMINOPHEN 5-325 MG PO TABS: 1.0000 | ORAL_TABLET | Freq: Four times a day (QID) | ORAL | Status: DC | PRN

## 2013-08-29 NOTE — Progress Notes (Signed)
Patient discharged to home with instructions. 

## 2013-08-29 NOTE — Discharge Summary (Signed)
Physician Discharge Summary  Patient ID: Jane Buckley MRN: [id number] DOB/AGE: [birth year] 52 y.o.  Admit date: 08/20/2013 Discharge date: 08/29/2013  Admitting Diagnosis: Abdominal pain SBO   Discharge Diagnosis Patient Active Problem List   Diagnosis Date Noted  . S/P laparotomy for adhesiolysis for SBO 08/26/2013  . Abdominal pain 08/21/2013  . Anemia 08/21/2013  . S/P hysterectomy with oophorectomy 02/07/2013  . Physical examination of potential organ donor 02/07/2013    Consultants Dr. Toniann Fail (Internal Medicine)  Imaging: No results found.  Procedures Dr. Derrell Lolling (08/23/13) - Exploratory laparotomy, Lysis of adhesions  Hospital Course:  52 y.o. female with no significant PMH presented to [facility] with complaints of abdominal pain, nausea and vomiting. Patient had presented early yesterday 3 AM with similar complaints and at that time had CT abdomen pelvis which showed features concerning for enteritis. Patient was discharged home on pain medications. Since patient has benign persistent nausea and vomiting with abdominal pain patient return back to the ER. The pain is mostly located periumbilical crampy in nature. Denies any diarrhea. Patient had multiple episodes of nausea vomiting denies any blood in it. Patient had similar episode 4 months ago and was admitted at [facility]. Patient presently has been admitted for further management since patient has been having intractable nausea vomiting with abdominal pain.    The surgery team was consulted when the patient's pain did not resole due to a suspicion of SBO given her surgical history.  Serial KUBs demonstrated patterns consistent with worsening bowel obstruction.  She also continued to have significant abdominal pain and nausea without relief.  The decision was made to explore the abdomen.  The patient underwent procedure listed above.  Tolerated procedure well and was transferred to the floor.  NG tube was maintained  until bowel function returned.  Diet was advanced as tolerated.  On POD #6, the patient was voiding well, tolerating diet, ambulating well, pain well controlled, vital signs stable, incisions c/d/i and felt stable for discharge home.  Patient will follow up in our office early next week for staple removal and in 2-3 weeks for post op visit.  She knows to call with questions or concerns.  Physical Exam: General:  Alert, NAD, pleasant, comfortable Abd:  Soft, ND, mild tenderness, midline abdominal incisions C/D/I with staples in place     Medication List    STOP taking these medications       promethazine 25 MG tablet  Commonly known as:  PHENERGAN      TAKE these medications       aspirin EC 81 MG tablet  Take 81 mg by mouth daily.     BIOTIN PO  Take 1 tablet by mouth daily.     Calcium-Magnesium-Zinc 333-133-5 MG Tabs  Take by mouth 2 (two) times daily.     cholecalciferol 1000 UNITS tablet  Commonly known as:  VITAMIN D  Take 1,000 Units by mouth daily.     HYDROcodone-acetaminophen 5-325 MG per tablet  Commonly known as:  NORCO/VICODIN  Take 1-2 tablets by mouth every 6 (six) hours as needed.     multivitamin tablet  Take 1 tablet by mouth daily.         Follow-up Information   Follow up with June Leap In 7 days. (F/U appt with Dr. Jacinto Halim nurse for staple removal in about 7 days)    Specialty:  General Surgery   Contact information:   [address] [place] [address]  Follow up with Lajean Saver, MD. Schedule an appointment as soon as possible for a visit in 2 weeks.   Specialty:  General Surgery   Contact information:   [address] [place] [address] [phone]       Signed: Aris Georgia, [facility] [phone]  08/29/2013, 8:52 AM

## 2013-08-29 NOTE — Discharge Summary (Signed)
Ayo Guarino O. Clif Serio, III, MD, FACS [phone]--pager [phone]--office [facility]  

## 2013-08-31 ENCOUNTER — Encounter (INDEPENDENT_AMBULATORY_CARE_PROVIDER_SITE_OTHER): Payer: 59

## 2013-08-31 ENCOUNTER — Telehealth (INDEPENDENT_AMBULATORY_CARE_PROVIDER_SITE_OTHER): Payer: Self-pay | Admitting: General Surgery

## 2013-08-31 NOTE — Telephone Encounter (Signed)
Message copied by June Leap on Thu Aug 31, 2013 12:37 PM ------      Message from: Senaida Ores      Created: Fri Aug 25, 2013  8:58 AM       Please schedule her for f/u appt in 10 days for staple removal, then she will need a f/u appt with Derrell Lolling in 2-3 weeks s/p ex lap and LOA for SBO ------

## 2013-08-31 NOTE — Telephone Encounter (Signed)
Jane Buckley has schd nurse only visit for staple removal 9/4 and post op with AR on 9/11

## 2013-09-01 ENCOUNTER — Ambulatory Visit (INDEPENDENT_AMBULATORY_CARE_PROVIDER_SITE_OTHER): Payer: 59

## 2013-09-01 VITALS — BP 122/78 | HR 88 | Temp 96.8°F | Resp 18

## 2013-09-01 DIAGNOSIS — Z4802 Encounter for removal of sutures: Secondary | ICD-10-CM

## 2013-09-01 NOTE — Progress Notes (Signed)
Pt in office for staple removal. Wd clean,dry,without swelling or redness. Staples removed. Benzoin and steri strips applied. Pt states she is tolerating normal diet and having normal BMs daily. Pt to keep po appt with MD 09-07-13. Pt will call with concerns.

## 2013-09-07 ENCOUNTER — Encounter (INDEPENDENT_AMBULATORY_CARE_PROVIDER_SITE_OTHER): Payer: Self-pay

## 2013-09-07 ENCOUNTER — Encounter (INDEPENDENT_AMBULATORY_CARE_PROVIDER_SITE_OTHER): Payer: Self-pay | Admitting: General Surgery

## 2013-09-07 ENCOUNTER — Ambulatory Visit (INDEPENDENT_AMBULATORY_CARE_PROVIDER_SITE_OTHER): Payer: 59 | Admitting: General Surgery

## 2013-09-07 VITALS — BP 108/68 | HR 76 | Resp 14 | Ht 65.5 in | Wt 116.6 lb

## 2013-09-07 DIAGNOSIS — Z9889 Other specified postprocedural states: Secondary | ICD-10-CM

## 2013-09-07 NOTE — Progress Notes (Signed)
Patient ID: Jane Buckley, female   DOB: 1961-10-11, 52 y.o.   MRN: [id number] The patient is a 52 year old female status post exploratory laparoscopy for a small bowel obstruction, lysis of adhesions. The patient has been doing well postoperatively and is slowly getting back to normal activity. Patient states her appetite is 100% but is trying is much as possible to help with nutrition. Patient is to have his controlled except for at night which takes pain medication.  On exam: Abdomen soft nontender nondistended with active bowel sounds. Her midline wound is clean dry and intact  Assessment and plan: 52 year old female status post exploratory laparoscopy for small bowel obstruction and lysis of adhesions. 1. The patient to be out of work for the next week and will be given note. At the end of next week she will give [place] a call back to potentially allow her back to work half days. 2. The patient can follow back up in one month if needed

## 2013-09-12 ENCOUNTER — Telehealth (INDEPENDENT_AMBULATORY_CARE_PROVIDER_SITE_OTHER): Payer: Self-pay | Admitting: General Surgery

## 2013-09-12 NOTE — Telephone Encounter (Signed)
Returned patient's call and let her know that the nutrition brand of ensure was ok to drink per AR and that the pink color under the scab is normal...pt denied any redness, drainage, foul odor or any fevers...patient verbalized POC at this time and will keep her follow up appt

## 2013-09-12 NOTE — Telephone Encounter (Signed)
Message copied by June Leap on Tue Sep 12, 2013  2:46 PM ------      Message from: Parks Neptune      Created: Tue Sep 12, 2013 12:13 PM      Regarding: Questions from Patient      Contact: [phone]        Is it possible to use Ensure and if so what (brand)? and scab is coming off and is pink is that normal? Not sure if it is an infection.  Please call pt.  DF ------

## 2013-09-15 ENCOUNTER — Encounter (INDEPENDENT_AMBULATORY_CARE_PROVIDER_SITE_OTHER): Payer: Self-pay | Admitting: General Surgery

## 2013-09-15 ENCOUNTER — Telehealth (INDEPENDENT_AMBULATORY_CARE_PROVIDER_SITE_OTHER): Payer: Self-pay | Admitting: General Surgery

## 2013-09-15 NOTE — Telephone Encounter (Signed)
Message copied by June Leap on Fri Sep 15, 2013  3:40 PM ------      Message from: Leanne Chang      Created: Fri Sep 15, 2013 11:37 AM      Regarding: Dr Marnette Burgess: [phone]       Needs return to work note extended. Was supposed to return on Monday but doesn't feel up to it. ------

## 2013-09-15 NOTE — Telephone Encounter (Signed)
Called and spoke with patient about extending her RTW date and per verbal orders a new RTW letter was typed up and faxed for patient to RTW 10/02/13 full duty and faxed to [phone] with case # [id number]...confirmation was received and copy of both the letter and confirm were mailed to pt per her request...patient verbalized agreement with POC at this time and was very thankful

## 2013-09-26 ENCOUNTER — Encounter (INDEPENDENT_AMBULATORY_CARE_PROVIDER_SITE_OTHER): Payer: Self-pay

## 2013-09-27 ENCOUNTER — Encounter (INDEPENDENT_AMBULATORY_CARE_PROVIDER_SITE_OTHER): Payer: Self-pay | Admitting: General Surgery

## 2013-10-06 ENCOUNTER — Ambulatory Visit (INDEPENDENT_AMBULATORY_CARE_PROVIDER_SITE_OTHER): Payer: 59 | Admitting: General Surgery

## 2013-10-06 ENCOUNTER — Encounter (INDEPENDENT_AMBULATORY_CARE_PROVIDER_SITE_OTHER): Payer: Self-pay | Admitting: General Surgery

## 2013-10-06 VITALS — BP 130/80 | HR 64 | Temp 99.1°F | Resp 14 | Ht 64.5 in | Wt 117.6 lb

## 2013-10-06 DIAGNOSIS — Z9889 Other specified postprocedural states: Secondary | ICD-10-CM

## 2013-10-06 NOTE — Progress Notes (Signed)
Patient ID: Jane Buckley, female   DOB: [birth year], 52 y.o.   MRN: [id number] The patient is a 52 year old female status post exploratory laparotomy and lysis of adhesions. Patient is returned back to work this week, 10/02/2013. She does state that she still has some back pain and some left sided incisional pain.  She does feel that she should begin her stamina back.  On exam: Her midline wound is clean dry and intact  Assessment and plan: 52 year old female status post exploratory laparotomy for small bowel function and lysis of adhesions. 1. The patient follow up as needed 2. I suggested to her to begin a probiotic and fiber bowel regimen to help with keeping her bowel movements regular.

## 2013-10-30 ENCOUNTER — Encounter (HOSPITAL_COMMUNITY): Payer: Self-pay | Admitting: Emergency Medicine

## 2013-10-30 ENCOUNTER — Emergency Department (HOSPITAL_COMMUNITY): Payer: 59

## 2013-10-30 ENCOUNTER — Inpatient Hospital Stay (HOSPITAL_COMMUNITY)
Admission: EM | Admit: 2013-10-30 | Discharge: 2013-11-02 | DRG: 390 | Disposition: A | Discharge status: Other Acute Inpatient Hospital | Payer: 59 | Attending: Surgery | Admitting: Surgery

## 2013-10-30 ENCOUNTER — Telehealth (INDEPENDENT_AMBULATORY_CARE_PROVIDER_SITE_OTHER): Payer: Self-pay | Admitting: General Surgery

## 2013-10-30 DIAGNOSIS — K565 Intestinal adhesions [bands], unspecified as to partial versus complete obstruction: Principal | ICD-10-CM | POA: Diagnosis present

## 2013-10-30 DIAGNOSIS — R1115 Cyclical vomiting syndrome unrelated to migraine: Secondary | ICD-10-CM | POA: Diagnosis present

## 2013-10-30 DIAGNOSIS — K589 Irritable bowel syndrome without diarrhea: Secondary | ICD-10-CM | POA: Diagnosis present

## 2013-10-30 DIAGNOSIS — R109 Unspecified abdominal pain: Secondary | ICD-10-CM

## 2013-10-30 DIAGNOSIS — K56609 Unspecified intestinal obstruction, unspecified as to partial versus complete obstruction: Secondary | ICD-10-CM

## 2013-10-30 DIAGNOSIS — R112 Nausea with vomiting, unspecified: Secondary | ICD-10-CM

## 2013-10-30 DIAGNOSIS — D649 Anemia, unspecified: Secondary | ICD-10-CM | POA: Diagnosis present

## 2013-10-30 DIAGNOSIS — Z87891 Personal history of nicotine dependence: Secondary | ICD-10-CM

## 2013-10-30 LAB — URINALYSIS, ROUTINE W REFLEX MICROSCOPIC
Glucose, UA: NEGATIVE mg/dL
Ketones, ur: 15 mg/dL — AB
Leukocytes, UA: NEGATIVE
Nitrite: NEGATIVE
Protein, ur: NEGATIVE mg/dL
Specific Gravity, Urine: >1.03 — ABNORMAL HIGH (ref 1.005–1.030)
pH: 5.5 (ref 5.0–8.0)

## 2013-10-30 LAB — URINE MICROSCOPIC-ADD ON

## 2013-10-30 LAB — CBC WITH DIFFERENTIAL/PLATELET
Basophils Absolute: 0 10*3/uL (ref 0.0–0.1)
Hemoglobin: 14.6 g/dL (ref 12.0–15.0)
Lymphocytes Relative: 26 % (ref 12–46)
Lymphs Abs: 1.3 10*3/uL (ref 0.7–4.0)
Neutro Abs: 3.4 10*3/uL (ref 1.7–7.7)
Neutrophils Relative %: 70 % (ref 43–77)
Platelets: 203 10*3/uL (ref 150–400)
RBC: 4.48 MIL/uL (ref 3.87–5.11)
RDW: 12.7 % (ref 11.5–15.5)
WBC: 4.9 10*3/uL (ref 4.0–10.5)

## 2013-10-30 LAB — COMPREHENSIVE METABOLIC PANEL
ALT: 13 U/L (ref 0–35)
AST: 22 U/L (ref 0–37)
Alkaline Phosphatase: 63 U/L (ref 39–117)
CO2: 26 mEq/L (ref 19–32)
Chloride: 100 mEq/L (ref 96–112)
GFR calc Af Amer: 85 mL/min — ABNORMAL LOW (ref >90)
GFR calc non Af Amer: 73 mL/min — ABNORMAL LOW (ref >90)
Glucose, Bld: 101 mg/dL — ABNORMAL HIGH (ref 70–99)
Potassium: 3.7 mEq/L (ref 3.5–5.1)
Sodium: 138 mEq/L (ref 135–145)
Total Protein: 8.7 g/dL — ABNORMAL HIGH (ref 6.0–8.3)

## 2013-10-30 MED ORDER — HYDROMORPHONE HCL PF 1 MG/ML IJ SOLN
1.0000 mg | Freq: Once | INTRAMUSCULAR | Status: AC
  Administered 2013-10-30: 1 mg via INTRAVENOUS
  Filled 2013-10-30: qty 1

## 2013-10-30 MED ORDER — SODIUM CHLORIDE 0.9 % IV BOLUS (SEPSIS)
1000.0000 mL | Freq: Once | INTRAVENOUS | Status: AC
  Administered 2013-10-30: 1000 mL via INTRAVENOUS

## 2013-10-30 MED ORDER — PROMETHAZINE HCL 25 MG/ML IJ SOLN
12.5000 mg | Freq: Four times a day (QID) | INTRAMUSCULAR | Status: DC | PRN
  Administered 2013-10-30: 25 mg via INTRAVENOUS
  Filled 2013-10-30 (×2): qty 1

## 2013-10-30 MED ORDER — ONDANSETRON HCL 4 MG/2ML IJ SOLN
4.0000 mg | Freq: Once | INTRAMUSCULAR | Status: AC
  Administered 2013-10-30: 4 mg via INTRAVENOUS
  Filled 2013-10-30: qty 2

## 2013-10-30 MED ORDER — DEXTROSE-NACL 5-0.9 % IV SOLN
INTRAVENOUS | Status: DC
  Administered 2013-10-30 – 2013-11-02 (×8): via INTRAVENOUS

## 2013-10-30 MED ORDER — MORPHINE SULFATE 2 MG/ML IJ SOLN
2.0000 mg | INTRAMUSCULAR | Status: DC | PRN
  Administered 2013-10-30 – 2013-11-02 (×9): 2 mg via INTRAVENOUS
  Filled 2013-10-30 (×9): qty 1

## 2013-10-30 MED ORDER — ENOXAPARIN SODIUM 40 MG/0.4ML ~~LOC~~ SOLN
40.0000 mg | SUBCUTANEOUS | Status: DC
  Administered 2013-10-30 – 2013-11-01 (×3): 40 mg via SUBCUTANEOUS
  Filled 2013-10-30 (×5): qty 0.4

## 2013-10-30 MED ORDER — ONDANSETRON HCL 4 MG/2ML IJ SOLN
4.0000 mg | Freq: Four times a day (QID) | INTRAMUSCULAR | Status: DC | PRN
  Administered 2013-10-31 – 2013-11-01 (×3): 4 mg via INTRAVENOUS
  Filled 2013-10-30 (×3): qty 2

## 2013-10-30 NOTE — ED Provider Notes (Signed)
CSN: [id number]     Arrival date & time 10/30/13  1205 History   First MD Initiated Contact with Patient 10/30/13 1359     Chief Complaint  Patient presents with  . Abdominal Pain   (Consider location/radiation/quality/duration/timing/severity/associated sxs/prior Treatment) HPI Comments: 52 year old female presents with recurrent abdominal pain since yesterday. States pain comes and goes and physically tightening. She was having vomiting most recent episode when she was in triage. As a fevers or chills. Denied dysuria. Had similar pain with a small bowel obstruction a couple months ago, which was operated on by Dr. Derrell Lolling. She last had a normal bowel movement this morning. She took some leftover hydrocodone which did not help the pain.   Past Medical History  Diagnosis Date  . IBS (irritable bowel syndrome)    Past Surgical History  Procedure Laterality Date  . 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 Abdominal hysterectomy  1995  . Tubal ligation  1993  . Lumbar disc surgery  2000's  . Laparotomy N/A 08/23/2013    Procedure: EXPLORATORY LAPAROTOMY, LYSIS OF ADHESIONS;  Surgeon: Axel Filler, MD;  Location: [facility] OR;  Service: General;  Laterality: N/A;   Family History  Problem Relation Age of Onset  . Cancer Father   . Heart disease Mother    History  Substance Use Topics  . Smoking status: Former Smoker -- 0.25 packs/day for .5 years    Types: Cigarettes    Quit date: 12/28/1980  . Smokeless tobacco: Never Used  . Alcohol Use: 0.6 oz/week    1 Glasses of wine per week   OB History   Grav Para Term Preterm Abortions TAB SAB Ect Mult Living                 Review of Systems  Constitutional: Negative for fever and chills.  Gastrointestinal: Positive for nausea, vomiting and abdominal pain. Negative for diarrhea, constipation and blood in stool.  Genitourinary: Negative for dysuria.  All other systems reviewed and are negative.    Allergies  Bactrim and Sulfa  antibiotics  Home Medications   Current Outpatient Rx  Name  Route  Sig  Dispense  Refill  . aspirin EC 81 MG tablet   Oral   Take 81 mg by mouth daily.         Marland Kitchen BIOTIN PO   Oral   Take 1 tablet by mouth daily.         . Calcium-Magnesium-Zinc 333-133-5 MG TABS   Oral   Take by mouth 2 (two) times daily.         . cholecalciferol (VITAMIN D) 1000 UNITS tablet   Oral   Take 1,000 Units by mouth daily.         Marland Kitchen HYDROcodone-acetaminophen (NORCO/VICODIN) 5-325 MG per tablet   Oral   Take 1-2 tablets by mouth every 6 (six) hours as needed.   40 tablet   0   . Multiple Vitamin (MULTIVITAMIN) tablet   Oral   Take 1 tablet by mouth daily.          BP 115/72  Pulse 79  Temp(Src) 98.6 F (37 C) (Oral)  Resp 16  SpO2 98%  LMP 08/20/1992 Physical Exam  Nursing note and vitals reviewed. Constitutional: She is oriented to person, place, and time. She appears well-developed and well-nourished.  HENT:  Head: Normocephalic and atraumatic.  Right Ear: External ear normal.  Left Ear: External ear normal.  Nose: Nose normal.  Eyes: Right eye exhibits no discharge.  Left eye exhibits no discharge.  Cardiovascular: Normal rate, regular rhythm and normal heart sounds.   Pulmonary/Chest: Effort normal and breath sounds normal.  Abdominal: Soft. She exhibits no distension. There is tenderness.  Well healed abdominal scar  Neurological: She is alert and oriented to person, place, and time.  Skin: Skin is warm and dry.    ED Course  Procedures (including critical care time) Labs Review Labs Reviewed  COMPREHENSIVE METABOLIC PANEL - Abnormal; Notable for the following:    Glucose, Bld 101 (*)    Total Protein 8.7 (*)    GFR calc non Af Amer 73 (*)    GFR calc Af Amer 85 (*)    All other components within normal limits  AMYLASE - Abnormal; Notable for the following:    Amylase 138 (*)    All other components within normal limits  URINALYSIS, ROUTINE W REFLEX  MICROSCOPIC - Abnormal; Notable for the following:    Specific Gravity, Urine >1.030 (*)    Hgb urine dipstick MODERATE (*)    Ketones, ur 15 (*)    All other components within normal limits  URINE MICROSCOPIC-ADD ON - Abnormal; Notable for the following:    Bacteria, UA FEW (*)    All other components within normal limits  CBC WITH DIFFERENTIAL  LIPASE, BLOOD   Imaging Review Dg Abd Acute W/chest  10/30/2013   CLINICAL DATA:  Initial encounter for current 2 day history of abdominal pain and nausea/vomiting. Prior history of small bowel obstruction.  EXAM: ACUTE ABDOMEN SERIES (ABDOMEN 2 VIEW & CHEST 1 VIEW)  COMPARISON:  Abdominal x-ray 08/23/2013, 08/22/2013, 08/21/2013. CT abdomen and pelvis 08/20/2013. No prior chest imaging.  FINDINGS: Recurrent moderate distention of multiple loops of jejunum in the left upper quadrant, demonstrating air-fluid levels on the erect image. Scattered colonic air-fluid levels without evidence of colonic distention. No free intraperitoneal air on the erect image. Phleboliths in the right side of the pelvis. No visible opaque urinary tract calculi. Regional skeleton intact with enthesopathic spurring on the iliac spines.  Cardiomediastinal silhouette unremarkable. Lungs clear. Bronchovascular markings normal. Pulmonary vascularity normal. No visible pleural effusions. No pneumothorax.  IMPRESSION: 1. Recurrent partial small bowel obstruction. No free intraperitoneal air. 2. No acute cardiopulmonary disease.   Electronically Signed   By: Hulan Saas M.D.   On: 10/30/2013 13:52    EKG Interpretation   None       MDM   1. Abdominal pain    Patient with a recurrent SBO. Surgery will admit.    Audree Camel, MD 10/30/13 [phone]

## 2013-10-30 NOTE — ED Notes (Signed)
Pt not requesting any pain medication at the moment "i want to wait it out".  This Rn told pt if she needed anything for pain to let us know. Pt verbalized understanding of this.

## 2013-10-30 NOTE — H&P (Signed)
I have seen and examined the patient and agree with the assessment and plans.  Will place NG and repeat films in the morning  Jane Buckley A. Jane Ivan  MD, FACS

## 2013-10-30 NOTE — H&P (Signed)
Chief Complaint: abdominal pain, nausea and vomiting   HPI: Jane Buckley is a 52 year old female with a history of hysterectomy, nephrectomy, exploratory laparotomy with lysis of adhesions done by Dr. Derrell Lolling on 08/23/13 for recurrent small bowel obstructions.  She had an unremarkable post op and was discharged.  She states that she has been feeling really good over the last 2 weeks.  Tolerating a diet, however, has been having a bowel movement 1 per week.  Yesterday morning she developed abdominal pain.  She ate rice for dinner, the intensity of pain increased. She developed vomiting around 1AM and threw up 6 times.  Location of pain is diffuse.  No aggravating or alleviating factors.  Modifying factors include; hydrocodone x2 doses without any relief.  She reports her last bm Monday 1 week ago.  Here in the ED she reports having a black watery stool.  She reports decreased urine output.    Past Medical History  Diagnosis Date  . IBS (irritable bowel syndrome)     Past Surgical History  Procedure Laterality Date  . 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 Abdominal hysterectomy  1995  . Tubal ligation  1993  . Lumbar disc surgery  2000's  . Laparotomy N/A 08/23/2013    Procedure: EXPLORATORY LAPAROTOMY, LYSIS OF ADHESIONS;  Surgeon: Axel Filler, MD;  Location: [facility] OR;  Service: General;  Laterality: N/A;    Family History  Problem Relation Age of Onset  . Cancer Father   . Heart disease Mother    Social History:  reports that she quit smoking about 32 years ago. Her smoking use included Cigarettes. She has a .125 pack-year smoking history. She has never used smokeless tobacco. She reports that she drinks about 0.6 ounces of alcohol per week. She reports that she does not use illicit drugs.  Allergies:  Allergies  Allergen Reactions  . Bactrim [Sulfamethoxazole-Trimethoprim] Shortness Of Breath  . Sulfa Antibiotics Shortness Of Breath     (Not in a hospital admission)  Results for  orders placed during the hospital encounter of 10/30/13 (from the past 48 hour(s))  CBC WITH DIFFERENTIAL     Status: None   Collection Time    10/30/13 12:55 PM      Result Value Range   WBC 4.9  4.0 - 10.5 K/uL   RBC 4.48  3.87 - 5.11 MIL/uL   Hemoglobin 14.6  12.0 - 15.0 g/dL   HCT 04.5  40.9 - 81.1 %   MCV 92.6  78.0 - 100.0 fL   MCH 32.6  26.0 - 34.0 pg   MCHC 35.2  30.0 - 36.0 g/dL   RDW 91.4  78.2 - 95.6 %   Platelets 203  150 - 400 K/uL   Neutrophils Relative % 70  43 - 77 %   Neutro Abs 3.4  1.7 - 7.7 K/uL   Lymphocytes Relative 26  12 - 46 %   Lymphs Abs 1.3  0.7 - 4.0 K/uL   Monocytes Relative 4  3 - 12 %   Monocytes Absolute 0.2  0.1 - 1.0 K/uL   Eosinophils Relative 0  0 - 5 %   Eosinophils Absolute 0.0  0.0 - 0.7 K/uL   Basophils Relative 0  0 - 1 %   Basophils Absolute 0.0  0.0 - 0.1 K/uL  COMPREHENSIVE METABOLIC PANEL     Status: Abnormal   Collection Time    10/30/13 12:55 PM      Result Value Range  Sodium 138  135 - 145 mEq/L   Potassium 3.7  3.5 - 5.1 mEq/L   Chloride 100  96 - 112 mEq/L   CO2 26  19 - 32 mEq/L   Glucose, Bld 101 (*) 70 - 99 mg/dL   BUN 11  6 - 23 mg/dL   Creatinine, Ser 1.61  0.50 - 1.10 mg/dL   Calcium 09.6  8.4 - 04.5 mg/dL   Total Protein 8.7 (*) 6.0 - 8.3 g/dL   Albumin 4.6  3.5 - 5.2 g/dL   AST 22  0 - 37 U/L   ALT 13  0 - 35 U/L   Alkaline Phosphatase 63  39 - 117 U/L   Total Bilirubin 0.5  0.3 - 1.2 mg/dL   GFR calc non Af Amer 73 (*) >90 mL/min   GFR calc Af Amer 85 (*) >90 mL/min   Comment: (NOTE)     The eGFR has been calculated using the CKD EPI equation.     This calculation has not been validated in all clinical situations.     eGFR's persistently <90 mL/min signify possible Chronic Kidney     Disease.  LIPASE, BLOOD     Status: None   Collection Time    10/30/13 12:55 PM      Result Value Range   Lipase 21  11 - 59 U/L  AMYLASE     Status: Abnormal   Collection Time    10/30/13 12:55 PM      Result Value  Range   Amylase 138 (*) 0 - 105 U/L  URINALYSIS, ROUTINE W REFLEX MICROSCOPIC     Status: Abnormal   Collection Time    10/30/13  1:08 PM      Result Value Range   Color, Urine YELLOW  YELLOW   APPearance CLEAR  CLEAR   Specific Gravity, Urine >1.030 (*) 1.005 - 1.030   pH 5.5  5.0 - 8.0   Glucose, UA NEGATIVE  NEGATIVE mg/dL   Hgb urine dipstick MODERATE (*) NEGATIVE   Bilirubin Urine NEGATIVE  NEGATIVE   Ketones, ur 15 (*) NEGATIVE mg/dL   Protein, ur NEGATIVE  NEGATIVE mg/dL   Urobilinogen, UA 0.2  0.0 - 1.0 mg/dL   Nitrite NEGATIVE  NEGATIVE   Leukocytes, UA NEGATIVE  NEGATIVE  URINE MICROSCOPIC-ADD ON     Status: Abnormal   Collection Time    10/30/13  1:08 PM      Result Value Range   Squamous Epithelial / LPF RARE  RARE   WBC, UA 0-2  <3 WBC/hpf   RBC / HPF 0-2  <3 RBC/hpf   Bacteria, UA FEW (*) RARE   Urine-Other LESS THAN 10 mL OF URINE SUBMITTED     Comment: MICROSCOPIC EXAM PERFORMED ON UNCONCENTRATED URINE     MUCOUS PRESENT   Dg Abd Acute W/chest  10/30/2013   CLINICAL DATA:  Initial encounter for current 2 day history of abdominal pain and nausea/vomiting. Prior history of small bowel obstruction.  EXAM: ACUTE ABDOMEN SERIES (ABDOMEN 2 VIEW & CHEST 1 VIEW)  COMPARISON:  Abdominal x-ray 08/23/2013, 08/22/2013, 08/21/2013. CT abdomen and pelvis 08/20/2013. No prior chest imaging.  FINDINGS: Recurrent moderate distention of multiple loops of jejunum in the left upper quadrant, demonstrating air-fluid levels on the erect image. Scattered colonic air-fluid levels without evidence of colonic distention. No free intraperitoneal air on the erect image. Phleboliths in the right side of the pelvis. No visible opaque urinary tract calculi. Regional skeleton intact  with enthesopathic spurring on the iliac spines.  Cardiomediastinal silhouette unremarkable. Lungs clear. Bronchovascular markings normal. Pulmonary vascularity normal. No visible pleural effusions. No pneumothorax.   IMPRESSION: 1. Recurrent partial small bowel obstruction. No free intraperitoneal air. 2. No acute cardiopulmonary disease.   Electronically Signed   By: Hulan Saas M.D.   On: 10/30/2013 13:52    Review of Systems  Constitutional: Positive for chills, malaise/fatigue and diaphoresis. Negative for weight loss.  Respiratory: Negative for cough, shortness of breath and wheezing.   Cardiovascular: Negative for chest pain and palpitations.  Gastrointestinal: Positive for nausea, vomiting, abdominal pain and constipation. Negative for diarrhea, blood in stool and melena.  Genitourinary: Positive for dysuria. Negative for hematuria.  Neurological: Positive for dizziness. Negative for loss of consciousness and weakness.    Blood pressure 115/72, pulse 79, temperature 98.6 F (37 C), temperature source Oral, resp. rate 16, last menstrual period 08/20/1992, SpO2 98.00%. Physical Exam  Constitutional: She is oriented to person, place, and time. She appears well-developed and well-nourished. She appears distressed.  HENT:  Head: Normocephalic and atraumatic.  Cardiovascular: Normal rate, regular rhythm, normal heart sounds and intact distal pulses.  Exam reveals no gallop and no friction rub.   No murmur heard. Respiratory: Effort normal and breath sounds normal. No respiratory distress. She has no wheezes. She has no rales. She exhibits no tenderness.  GI: Soft. Bowel sounds are normal. She exhibits no distension. There is tenderness.  Voluntary guarding, no peritonitis.  Well healed midline incision   Musculoskeletal: She exhibits no edema and no tenderness.  Lymphadenopathy:    She has no cervical adenopathy.  Neurological: She is alert and oriented to person, place, and time.  Skin: Skin is warm and dry. No rash noted. She is not diaphoretic. No erythema. No pallor.     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  Derrell Lolling  -place NGT to low intermittent suction -NPO -repeat XR in AM.  We will consider a CT scan should she not improve with NGTplacement -pain control -antiemetics -IV hydration -SCD, lovenox for VTE prophylaxis.   -repeat labs in AM -up as tolerated   Alissa Pharr ANP-BC 10/30/2013, 3:15 PM

## 2013-10-30 NOTE — ED Notes (Addendum)
Rt sided abd pain since yesterday states has hx of bowel obst had surgery for that im august also has had n/v also since yesterday

## 2013-10-30 NOTE — Telephone Encounter (Signed)
Patient calling in status post exploratory laparotomy for small bowel obstruction on 08/23/2013. She started having stomach pains yesterday around noon. She states this were sharp and mainly in the RUQ. She states the pain has gotten worse and is currently 7/10. She is not passing gas. She had one bowel movement last night, which was her first in 6 days. She is having abdominal swelling and nausea and vomiting x 4 since last night. Please advise. Dr Derrell Lolling paged at 9:10 am. Spoke to Dr Derrell Lolling who advised due to symptoms and history for patient to be worked up at [facility] ER. Patient made aware.

## 2013-10-31 ENCOUNTER — Observation Stay (HOSPITAL_COMMUNITY): Payer: 59

## 2013-10-31 DIAGNOSIS — D649 Anemia, unspecified: Secondary | ICD-10-CM

## 2013-10-31 DIAGNOSIS — K5900 Constipation, unspecified: Secondary | ICD-10-CM

## 2013-10-31 LAB — BASIC METABOLIC PANEL
BUN: 9 mg/dL (ref 6–23)
CO2: 24 mEq/L (ref 19–32)
Calcium: 8 mg/dL — ABNORMAL LOW (ref 8.4–10.5)
Chloride: 111 mEq/L (ref 96–112)
Creatinine, Ser: 0.89 mg/dL (ref 0.50–1.10)
GFR calc Af Amer: 86 mL/min — ABNORMAL LOW (ref >90)
GFR calc non Af Amer: 74 mL/min — ABNORMAL LOW (ref >90)
Glucose, Bld: 93 mg/dL (ref 70–99)
Potassium: 3.4 mEq/L — ABNORMAL LOW (ref 3.5–5.1)

## 2013-10-31 LAB — CBC
HCT: 32.6 % — ABNORMAL LOW (ref 36.0–46.0)
Hemoglobin: 11.6 g/dL — ABNORMAL LOW (ref 12.0–15.0)
MCHC: 35.6 g/dL (ref 30.0–36.0)
Platelets: 142 10*3/uL — ABNORMAL LOW (ref 150–400)
RDW: 13.1 % (ref 11.5–15.5)
WBC: 3.3 10*3/uL — ABNORMAL LOW (ref 4.0–10.5)

## 2013-10-31 MED ORDER — WHITE PETROLATUM GEL
Status: AC
  Administered 2013-10-31: 0.2
  Filled 2013-10-31: qty 5

## 2013-10-31 MED ORDER — BIOTENE DRY MOUTH MT LIQD
15.0000 mL | Freq: Two times a day (BID) | OROMUCOSAL | Status: DC
  Administered 2013-10-31 – 2013-11-01 (×4): 15 mL via OROMUCOSAL

## 2013-10-31 MED ORDER — CHLORHEXIDINE GLUCONATE 0.12 % MT SOLN
15.0000 mL | Freq: Two times a day (BID) | OROMUCOSAL | Status: DC
  Administered 2013-10-31 – 2013-11-02 (×4): 15 mL via OROMUCOSAL
  Filled 2013-10-31 (×4): qty 15

## 2013-10-31 NOTE — Progress Notes (Signed)
Subjective: Pt states she vomited x3 last evening, none since.  Reports "black loose stools."  Has not had pain meds since 2126.  Objective: Vital signs in last 24 hours: Temp:  [97.6 F (36.4 C)-98.6 F (37 C)] 98.4 F (36.9 C) 11/18/23 0510) Pulse Rate:  [61-103] 61 11/18/23 0510) Resp:  [16-18] 16 2023/11/18 0510) BP: (101-123)/(48-84) 101/60 mmHg 11/18/2023 0510) SpO2:  [98 %-100 %] 100 % 18-Nov-2023 0510) Weight:  [114 lb 6.7 oz (51.9 kg)] 114 lb 6.7 oz (51.9 kg) (11/03 1702) Last BM Date: 10/30/13  Intake/Output from previous day: 11/03 0701 - November 18, 2023 0700 In: 831.3 [I.V.:831.3] Out: -  Intake/Output this shift:    Physical Exam  Constitutional: She is oriented to person, place, and time. She appears well-developed and well-nourished. She does not appear in any acute distress.  Cardiovascular: Normal rate, regular rhythm, normal heart sounds and intact distal pulses. Exam reveals no gallop and no friction rub.  No murmur heard.  Respiratory: Effort normal and breath sounds normal. No respiratory distress. She has no wheezes. She has no rales. She exhibits no tenderness.  GI: Soft. Bowel sounds are normal. She exhibits no distension. There is generalized tenderness with voluntary guarding.  No evidence of peritonitis. Well healed midline incision  Musculoskeletal: She exhibits no edema and no tenderness.  Neurological: She is alert and oriented to person, place, and time.  Skin: Skin is warm and dry. No rash noted. She is not diaphoretic. No erythema. No pallor.   Lab Results:   Recent Labs  10/30/13 1255 11/17/13 0610  WBC 4.9 3.3*  HGB 14.6 11.6*  HCT 41.5 32.6*  PLT 203 142*   BMET  Recent Labs  10/30/13 1255 17-Nov-2013 0610  NA 138 141  K 3.7 3.4*  CL 100 111  CO2 26 24  GLUCOSE 101* 93  BUN 11 9  CREATININE 0.90 0.89  CALCIUM 10.0 8.0*   PT/INR No results found for this basename: LABPROT, INR,  in the last 72 hours ABG No results found for this basename: PHART,  PCO2, PO2, HCO3,  in the last 72 hours  Studies/Results: Dg Abd 2 Views  11-17-2013   CLINICAL DATA:  Right upper abdominal pain.  EXAM: ABDOMEN - 2 VIEW  COMPARISON:  10/30/2013  FINDINGS: No evidence for free air. Nasogastric tube is overlying the right abdomen and near the gastric antrum and duodenum bulb region. There is a dilated loop of small bowel in the left abdomen that measures up to 3.8 cm. There appears to be increased gas within the small bowel compared to the comparison study. Minimal gas in the colon.  IMPRESSION: Gas-filled loops of small bowel with a dilated loop in the left abdomen. Findings are suggestive 40for at least a partial small bowel obstruction.   Electronically Signed   By: Richarda Overlie M.D.   On: 2013-11-17 09:23   Dg Abd Acute W/chest  10/30/2013   CLINICAL DATA:  Initial encounter for current 2 day history of abdominal pain and nausea/vomiting. Prior history of small bowel obstruction.  EXAM: ACUTE ABDOMEN SERIES (ABDOMEN 2 VIEW & CHEST 1 VIEW)  COMPARISON:  Abdominal x-ray 08/23/2013, 08/22/2013, 08/21/2013. CT abdomen and pelvis 08/20/2013. No prior chest imaging.  FINDINGS: Recurrent moderate distention of multiple loops of jejunum in the left upper quadrant, demonstrating air-fluid levels on the erect image. Scattered colonic air-fluid levels without evidence of colonic distention. No free intraperitoneal air on the erect image. Phleboliths in the right side of the pelvis. No  visible opaque urinary tract calculi. Regional skeleton intact with enthesopathic spurring on the iliac spines.  Cardiomediastinal silhouette unremarkable. Lungs clear. Bronchovascular markings normal. Pulmonary vascularity normal. No visible pleural effusions. No pneumothorax.  IMPRESSION: 1. Recurrent partial small bowel obstruction. No free intraperitoneal air. 2. No acute cardiopulmonary disease.   Electronically Signed   By: Hulan Saas M.D.   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 Derrell Lolling -NGT just now placed on suction, no return thus far -continue NPO -XR unchanged -pain control  -antiemetics  Constipation -miralax when tolerating orals -outpatient preventative colonoscopy discussed  Chronic anemia -I suspect she was dehydrated yesterday.  H&h are stable. -repeat CBC in AM to ensure it remains stable  VTE prophylaxis SCD, lovenox      LOS: 1 day    Taegan Standage ANP-BC 10/31/2013 9:50 AM

## 2013-10-31 NOTE — Progress Notes (Signed)
I have seen and examined the patient and agree with the assessment and plans. If not improving in 24 hours, a get will get a CT scan  Michaelpaul Apo A. [name]  MD, FACS

## 2013-11-01 ENCOUNTER — Inpatient Hospital Stay (HOSPITAL_COMMUNITY): Payer: 59

## 2013-11-01 ENCOUNTER — Encounter (HOSPITAL_COMMUNITY): Payer: Self-pay | Admitting: Radiology

## 2013-11-01 LAB — CBC
HCT: 32.5 % — ABNORMAL LOW (ref 36.0–46.0)
Hemoglobin: 11.3 g/dL — ABNORMAL LOW (ref 12.0–15.0)
MCH: 32.8 pg (ref 26.0–34.0)
MCHC: 34.8 g/dL (ref 30.0–36.0)
MCV: 94.2 fL (ref 78.0–100.0)
RBC: 3.45 MIL/uL — ABNORMAL LOW (ref 3.87–5.11)
RDW: 13.4 % (ref 11.5–15.5)
WBC: 2.5 10*3/uL — ABNORMAL LOW (ref 4.0–10.5)

## 2013-11-01 MED ORDER — IOHEXOL 300 MG/ML  SOLN
100.0000 mL | Freq: Once | INTRAMUSCULAR | Status: AC | PRN
  Administered 2013-11-01: 100 mL via INTRAVENOUS

## 2013-11-01 NOTE — Progress Notes (Signed)
Subjective: No further black stools.  +nausea and pain.  Not much better.   Objective: Vital signs in last 24 hours: Temp:  [98.2 F (36.8 C)-98.9 F (37.2 C)] 98.3 F (36.8 C) (11/05 0153) Pulse Rate:  [61-68] 61 (11/05 0153) Resp:  [16-18] 16 (11/05 0153) BP: (103-126)/(53-60) 104/60 mmHg (11/05 0153) SpO2:  [88 %-100 %] 100 % (11/05 0153) Last BM Date: 2013-11-14  Intake/Output from previous day: 11/15/23 0701 - 11/05 0700 In: 3585.4 [I.V.:3535.4; NG/GT:50] Out: 400 [Emesis/NG output:400] Intake/Output this shift:    Physical Exam  Constitutional: She is oriented to person, place, and time. She appears well-developed and well-nourished. She does not appear in any acute distress.  Cardiovascular: Normal rate, regular rhythm, normal heart sounds and intact distal pulses. Exam reveals no gallop and no friction rub. No murmur heard.  Respiratory: Effort normal and breath sounds normal. No respiratory distress. She has no wheezes. She has no rales. She exhibits no tenderness.  GI: Soft. Bowel sounds are normal. She exhibits no distension. There is generalized tenderness with voluntary guarding. No evidence of peritonitis. Well healed midline incision  Musculoskeletal: She exhibits no edema and no tenderness.  Neurological: She is alert and oriented to person, place, and time.  Skin: Skin is warm and dry. No rash noted. She is not diaphoretic. No erythema. No pallor.   Lab Results:   Recent Labs  2013-11-14 0610 11/01/13 0436  WBC 3.3* 2.5*  HGB 11.6* 11.3*  HCT 32.6* 32.5*  PLT 142* 140*   BMET  Recent Labs  10/30/13 1255 11/14/2013 0610  NA 138 141  K 3.7 3.4*  CL 100 111  CO2 26 24  GLUCOSE 101* 93  BUN 11 9  CREATININE 0.90 0.89  CALCIUM 10.0 8.0*   PT/INR No results found for this basename: LABPROT, INR,  in the last 72 hours ABG No results found for this basename: PHART, PCO2, PO2, HCO3,  in the last 72 hours  Studies/Results: Dg Abd 2 Views  2013-11-14    CLINICAL DATA:  Right upper abdominal pain.  EXAM: ABDOMEN - 2 VIEW  COMPARISON:  10/30/2013  FINDINGS: No evidence for free air. Nasogastric tube is overlying the right abdomen and near the gastric antrum and duodenum bulb region. There is a dilated loop of small bowel in the left abdomen that measures up to 3.8 cm. There appears to be increased gas within the small bowel compared to the comparison study. Minimal gas in the colon.  IMPRESSION: Gas-filled loops of small bowel with a dilated loop in the left abdomen. Findings are suggestive 9for at least a partial small bowel obstruction.   Electronically Signed   By: Richarda Overlie M.D.   On: 11/14/13 09:23   Dg Abd Acute W/chest  10/30/2013   CLINICAL DATA:  Initial encounter for current 2 day history of abdominal pain and nausea/vomiting. Prior history of small bowel obstruction.  EXAM: ACUTE ABDOMEN SERIES (ABDOMEN 2 VIEW & CHEST 1 VIEW)  COMPARISON:  Abdominal x-ray 08/23/2013, 08/22/2013, 08/21/2013. CT abdomen and pelvis 08/20/2013. No prior chest imaging.  FINDINGS: Recurrent moderate distention of multiple loops of jejunum in the left upper quadrant, demonstrating air-fluid levels on the erect image. Scattered colonic air-fluid levels without evidence of colonic distention. No free intraperitoneal air on the erect image. Phleboliths in the right side of the pelvis. No visible opaque urinary tract calculi. Regional skeleton intact with enthesopathic spurring on the iliac spines.  Cardiomediastinal silhouette unremarkable. Lungs clear. Bronchovascular markings normal. Pulmonary  vascularity normal. No visible pleural effusions. No pneumothorax.  IMPRESSION: 1. Recurrent partial small bowel obstruction. No free intraperitoneal air. 2. No acute cardiopulmonary disease.   Electronically Signed   By: Hulan Saas M.D.   On: 10/30/2013 13:52   Dg Vangie Bicker G Tube Plc W/fl-no Rad  10/31/2013   CLINICAL DATA: small bowel obstruction   NASO G TUBE PLACEMENT WITH  FLUORO  Fluoroscopy was utilized by the requesting physician.  No radiographic  interpretation.     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 Derrell Lolling  -NGT 431ml/24h bilious, watery -continue NPO  -pain control  -antiemetics  -proceed with CT of abdomen, ?gallbladder Constipation  -miralax when tolerating orals  -outpatient preventative colonoscopy discussed  Chronic anemia  -stable -hemoccult x3 if h&h drops  VTE prophylaxis  SCD, lovenox, mobilize     LOS: 2 days    Brenlee Koskela ANP-BC 11/01/2013 9:21 AM

## 2013-11-01 NOTE — Progress Notes (Signed)
I have seen and examined the patient and agree with the assessment and plans.  We will see if a CT scan will help determine whether SBO is present  Sriyan Cutting A. [name]  MD, FACS

## 2013-11-01 NOTE — Progress Notes (Addendum)
i paged Dr. Haynes Hoehn who is the patients kidney donor MD at [phone].  Awaiting callback at this time.  Spoke with Dr. Lowell Guitar, wanted to know if her symptoms are a result of her nephrectomy.  Pt has had several surgeries in the past.  Discussed plan of care and feels comfortable.  Offered to transfer pt to [facility] if deemed necessary.

## 2013-11-02 ENCOUNTER — Encounter (INDEPENDENT_AMBULATORY_CARE_PROVIDER_SITE_OTHER): Payer: Self-pay

## 2013-11-02 DIAGNOSIS — E46 Unspecified protein-calorie malnutrition: Secondary | ICD-10-CM

## 2013-11-02 LAB — BASIC METABOLIC PANEL
CO2: 25 mEq/L (ref 19–32)
Calcium: 8.5 mg/dL (ref 8.4–10.5)
Creatinine, Ser: 0.88 mg/dL (ref 0.50–1.10)
GFR calc Af Amer: 87 mL/min — ABNORMAL LOW (ref >90)
GFR calc non Af Amer: 75 mL/min — ABNORMAL LOW (ref >90)
Glucose, Bld: 94 mg/dL (ref 70–99)
Sodium: 141 mEq/L (ref 135–145)

## 2013-11-02 LAB — CBC
Hemoglobin: 10.8 g/dL — ABNORMAL LOW (ref 12.0–15.0)
MCH: 31.8 pg (ref 26.0–34.0)
MCV: 92.6 fL (ref 78.0–100.0)
Platelets: 145 10*3/uL — ABNORMAL LOW (ref 150–400)
RBC: 3.4 MIL/uL — ABNORMAL LOW (ref 3.87–5.11)
RDW: 12.9 % (ref 11.5–15.5)

## 2013-11-02 LAB — PHOSPHORUS: Phosphorus: 3.3 mg/dL (ref 2.3–4.6)

## 2013-11-02 LAB — MAGNESIUM: Magnesium: 1.6 mg/dL (ref 1.5–2.5)

## 2013-11-02 MED ORDER — POTASSIUM CHLORIDE 10 MEQ/100ML IV SOLN: 10.0000 meq | INTRAVENOUS | Status: DC

## 2013-11-02 MED ORDER — INSULIN ASPART 100 UNIT/ML ~~LOC~~ SOLN: 0.0000 [IU] | SUBCUTANEOUS | Status: DC

## 2013-11-02 MED ORDER — TRACE MINERALS CR-CU-F-FE-I-MN-MO-SE-ZN IV SOLN
INTRAVENOUS | Status: DC
  Filled 2013-11-02: qty 1000

## 2013-11-02 MED ORDER — FAT EMULSION 20 % IV EMUL
240.0000 mL | INTRAVENOUS | Status: DC
  Filled 2013-11-02: qty 250

## 2013-11-02 MED ORDER — SODIUM CHLORIDE 0.9 % IV SOLN: INTRAVENOUS | Status: DC

## 2013-11-02 NOTE — Progress Notes (Addendum)
PARENTERAL NUTRITION CONSULT NOTE - INITIAL  Pharmacy Consult for TNA Indication: bowel obstruction  Allergies  Allergen Reactions  . Bactrim [Sulfamethoxazole-Trimethoprim] Shortness Of Breath  . Sulfa Antibiotics Shortness Of Breath    Patient Measurements: Height: 5' 4.5" (163.8 cm) Weight: 114 lb 6.7 oz (51.9 kg) IBW/kg (Calculated) : 55.85   Vital Signs: Temp: 98.5 F (36.9 C) (11/06 0547) Temp src: Oral (11/06 0547) BP: 110/55 mmHg (11/06 0547) Pulse Rate: 60 (11/06 0547) Intake/Output from previous day: 11/05 0701 - 11/06 0700 In: 3012.5 [I.V.:3012.5] Out: 200 [Emesis/NG output:200] Intake/Output from this shift:    Labs:  Recent Labs  10/31/13 0610 11/01/13 0436 11/02/13 0528  WBC 3.3* 2.5* 2.9*  HGB 11.6* 11.3* 10.8*  HCT 32.6* 32.5* 31.5*  PLT 142* 140* 145*     Recent Labs  10/30/13 1255 10/31/13 0610 11/02/13 0528  NA 138 141 141  K 3.7 3.4* 3.0*  CL 100 111 106  CO2 26 24 25   GLUCOSE 101* 93 94  BUN 11 9 <3*  CREATININE 0.90 0.89 0.88  CALCIUM 10.0 8.0* 8.5  PROT 8.7*  --   --   ALBUMIN 4.6  --   --   AST 22  --   --   ALT 13  --   --   ALKPHOS 63  --   --   BILITOT 0.5  --   --    Estimated Creatinine Clearance: 62.7 ml/min (by C-G formula based on Cr of 0.88).   No results found for this basename: GLUCAP,  in the last 72 hours  Medical History: Past Medical History  Diagnosis Date  . IBS (irritable bowel syndrome)     Medications:  Scheduled:  . antiseptic oral rinse  15 mL Mouth Rinse q12n4p  . chlorhexidine  15 mL Mouth Rinse BID  . enoxaparin (LOVENOX) injection  40 mg Subcutaneous Q24H    Insulin Requirements in the past 24 hours:  No insulin ordered currently  Current Nutrition:  NPO Has D5/NS running at 182mL/hr which provides 150g of dextrose and about 600 kcal  Nutritional Goals:  1300-1820 kCal, 78 grams of protein per day, fluid requirement ~1563mL/day- all based on population  calculations  Assessment: 39 YOF with history of hysterectomy, nephrectomy, and ex lap with lysis of adhesions for recurrent small bowel obstructions done this past August admitted 11/3 with abdominal pain and no BM for about 1 week.   GI: NGT placed- has put out in past 24 hours per charting. Is having flatus but no BM. Still with abdominal pain. Endo: no history. Blood glucose readings good on BMET Lytes: Na 141, K 3. Phos 3.3, mag 1.6 Renal: hx kidney donor. SCr 0.88 with est CrCl ~60-31mL/min Pulm: 97% on room air Cards: BP low-normal, HR brady-normal. No cardiovascular medications Hepatobil: LFTs all WNL on 11/3- albumin 4.6, Tbili 0.5 Neuro: A&O ID:   WBC 2.9, afebrile, no antibiotics Best Practices: SCDs, Lovenox, MC TPN Access: PICC line to be placed TPN day#: 0 (starting 11/6)  Plan:  - start Clinimix E 5/15 with multivitamin and trace elements at 3mL/hr tonight at 1800 - start fat emulsion 20% at 31mL/hr - goal will be Clinimix E 5/15 at 42mL/hr + fat emulsion 20% at 4mL/hr which will provide 78g protein/day and 1680 kcal/day  -change fluids to NS at 1800 tonight and reduce rate to 57mL/hr to adjust for fluids that will now be received via TPN- MD please address fluids and if MIVF are still needed -  if patient will still be here for a few more hours before transport to [facility], will replete mag and phos - TPN labs as ordered - CBGs and sensitive SSI q4h  Joyell Emami D. Lyn Joens, PharmD Clinical Pharmacist Pager: [phone] 11/02/2013 11:20 AM

## 2013-11-02 NOTE — Progress Notes (Signed)
Called report to RN receiving pt at [facility] with pt updates and current status with plan of care. Awaiting Care link for transfer, pt has no further questions at this time.

## 2013-11-02 NOTE — Progress Notes (Signed)
Patient ID: Jane Buckley, female   DOB: 1961-06-26, 52 y.o.   MRN: [id number]    Subjective: Pt doesn't feel well.  Some flatus but no BM.  C/o some abdominal pain  Objective: Vital signs in last 24 hours: Temp:  [98.3 F (36.8 C)-98.5 F (36.9 C)] 98.5 F (36.9 C) (11/06 0547) Pulse Rate:  [55-60] 60 (11/06 0547) Resp:  [18] 18 (11/06 0547) BP: (110-135)/(55-62) 110/55 mmHg (11/06 0547) SpO2:  [97 %-100 %] 97 % (11/06 0547) Last BM Date: 10/31/13  Intake/Output from previous day: 11/05 0701 - 11/06 0700 In: 3012.5 [I.V.:3012.5] Out: 200 [Emesis/NG output:200] Intake/Output this shift:    PE: Abd: soft, ND, some lower abdominal tenderness, +BS, NGT with minimal bilious output Heart: regular Lungs: CTAB  Lab Results:   Recent Labs  11/01/13 0436 11/02/13 0528  WBC 2.5* 2.9*  HGB 11.3* 10.8*  HCT 32.5* 31.5*  PLT 140* 145*   BMET  Recent Labs  10/31/13 0610 11/02/13 0528  NA 141 141  K 3.4* 3.0*  CL 111 106  CO2 24 25  GLUCOSE 93 94  BUN 9 <3*  CREATININE 0.89 0.88  CALCIUM 8.0* 8.5   PT/INR No results found for this basename: LABPROT, INR,  in the last 72 hours CMP     Component Value Date/Time   NA 141 11/02/2013 0528   K 3.0* 11/02/2013 0528   CL 106 11/02/2013 0528   CO2 25 11/02/2013 0528   GLUCOSE 94 11/02/2013 0528   BUN <3* 11/02/2013 0528   CREATININE 0.88 11/02/2013 0528   CALCIUM 8.5 11/02/2013 0528   PROT 8.7* 10/30/2013 1255   ALBUMIN 4.6 10/30/2013 1255   AST 22 10/30/2013 1255   ALT 13 10/30/2013 1255   ALKPHOS 63 10/30/2013 1255   BILITOT 0.5 10/30/2013 1255   GFRNONAA 75* 11/02/2013 0528   GFRAA 87* 11/02/2013 0528   Lipase     Component Value Date/Time   LIPASE 21 10/30/2013 1255       Studies/Results: Ct Abdomen Pelvis W Contrast  11/01/2013   CLINICAL DATA:  Abdominal pain. Prior renal donation and tubal ligation. Status post laparotomy for adhesions and small bowel obstruction.  EXAM: CT ABDOMEN AND PELVIS WITH CONTRAST   TECHNIQUE: Multidetector CT imaging of the abdomen and pelvis was performed using the standard protocol following bolus administration of intravenous contrast.  CONTRAST:  OMNIPAQUE IOHEXOL 300 MG/ML  SOLN  COMPARISON:  Plain films including 1 day prior. CT 08/20/2013.  FINDINGS: Lower Chest: Clear lung bases. Normal heart size. Trace right-sided pleural fluid.  Abdomen/Pelvis: Too small to characterize right liver lobe lesion. Focal steatosis adjacent the falciform ligament. Normal spleen. Nasogastric tube which terminates at the gastric antrum or pylorus. Normal pancreas, gallbladder, biliary tract, adrenal glands.  Normal right kidney. Status post left nephrectomy. No retroperitoneal or retrocrural adenopathy.  Normal appearance of the transverse and descending colon. The ascending colon appears thick-walled on image 42/ series 2. This could be due to underdistention. Normal terminal ileum. Appendix likely a diminutive on image 60/ series 2.  Per small bowel loops are normal in caliber proximally, without high-grade obstruction. There are clustered loops of small bowel within the left upper quadrant and peripheral left-sided abdomen (anterior and lateral to the descending colon). Example image 20/ series 2. In the low left side abdomen, bowel loops measure up to 2.6 cm. Upper normal to minimally dilated. Apparent mass effect upon bowel loops in the left side of the abdomen, including on  image 38/series 2 and coronal image 44. No pneumatosis or free intraperitoneal air. There is new abdominal pelvic ascites since 08/20/2013. Small in volume. No small bowel wall thickening  No pelvic adenopathy. Normal urinary bladder. Hysterectomy. Suspect right ovarian follicle at 1.6 cm on image 60.  Bones/Musculoskeletal:  No acute osseous abnormality.  IMPRESSION: 1. No evidence of high-grade bowel obstruction. Multiple subtle left-sided abdominal findings, including peripheral position of small bowel loops, borderline  dilatation, and mass effect within the central left abdomen. Although these findings may simply relate to adhesions and low grade partial obstruction, an underlying internal hernia and/or closed loop obstruction is difficult to entirely exclude. No evidence of complicating ischemia. 2. Small volume abdominal pelvic ascites, of indeterminate etiology. Favored to be related to the presumed partial small bowel obstruction. 3. Apparent right-sided colonic wall thickening, favored to be due to underdistention. 4. Small right sided pleural effusion.   Electronically Signed   By: Jeronimo Greaves M.D.   On: 11/01/2013 17:06    Anti-infectives: Anti-infectives   None       Assessment/Plan  1. PSBO 2. PCM/TNA  Plan: 1. CT scan shows a PSBO.  There is some concern for possible internal hernia or closed loop, but clinically right now she doesn't appear that way.  Will continue bowel rest and NGT.  Will start TNA for prolonged bowel rest.    LOS: 3 days    Keshanna Riso E 11/02/2013, 10:31 AM Pager: [phone]

## 2013-11-02 NOTE — Discharge Summary (Signed)
Patient ID: Jane Buckley MRN: [id number] DOB/AGE: [birth year] 52 y.o.  Admit date: 10/30/2013 Discharge date: 11/02/2013  Procedures: none  Consults: None  Reason for Admission: Donald Jacque is a 52 year old female with a history of hysterectomy, nephrectomy, exploratory laparotomy with lysis of adhesions done by Dr. Derrell Lolling on 08/23/13 for recurrent small bowel obstructions. She had an unremarkable post op and was discharged. She states that she has been feeling really good over the last 2 weeks. Tolerating a diet, however, has been having a bowel movement 1 per week. Yesterday morning she developed abdominal pain. She ate rice for dinner, the intensity of pain increased. She developed vomiting around 1AM and threw up 6 times. Location of pain is diffuse. No aggravating or alleviating factors. Modifying factors include; hydrocodone x2 doses without any relief. She reports her last bm Monday 1 week ago. Here in the ED she reports having a black watery stool. She reports decreased urine output.   Admission Diagnoses:  1. PSBO 2. S/p recent ex lap with LOA for SBO 3. IBS 4. S/p Kidney donation  Hospital Course: the patient was admitted and an NGT was placed for persistent nausea and vomiting.  She required a CT scan as the patient was continuing to complain of abdominal pain and minimal gas.  Her CT scan revealed : 1. No evidence of high-grade bowel obstruction. Multiple subtle  left-sided abdominal findings, including peripheral position of  small bowel loops, borderline dilatation, and mass effect within the  central left abdomen. Although these findings may simply relate to  adhesions and low grade partial obstruction, an underlying internal  hernia and/or closed loop obstruction is difficult to entirely  exclude. No evidence of complicating ischemia.  2. Small volume abdominal pelvic ascites, of indeterminate etiology.  Favored to be related to the presumed partial small bowel   obstruction.  3. Apparent right-sided colonic wall thickening, favored to be due  to underdistention.  4. Small right sided pleural effusion  Due to the above results, we felt the patient may need several more days of continued bowel rest to try and resolve without having to proceed with an operation as she did not shows signs of definite obstruction.  Therefor a PICC line and TNA was ordered.  However, due to the possibility of internal hernia or closed loop obstruction, she would be closely monitored for signs of worsening obstruction, pain, and clinical evidence of this.  She does not appear clinically to have either of these two problems at this time.  During this stay, the patient contacted her Kidney donor doctors, who contacted [place] and would like for the patient to be transferred to [facility].  We were agreeable.  She was in stable condition upon day of transfer  Discharge Diagnoses:  1. PSBO 2. S/p kidney donation  Discharge Medications:   Medication List    ASK your doctor about these medications       aspirin EC 81 MG tablet  Take 81 mg by mouth daily.     Calcium-Magnesium-Zinc 333-133-5 MG Tabs  Take 1 tablet by mouth 2 (two) times daily.     cholecalciferol 1000 UNITS tablet  Commonly known as:  VITAMIN D  Take 1,000 Units by mouth daily.     HYDROcodone-acetaminophen 5-325 MG per tablet  Commonly known as:  NORCO/VICODIN  Take 1 tablet by mouth every 6 (six) hours as needed for pain.     multivitamin tablet  Take 1 tablet by mouth daily.  polyethylene glycol packet  Commonly known as:  MIRALAX / GLYCOLAX  Take 17 g by mouth daily as needed (for constipation).     SYSTANE 0.4-0.3 % Soln  Generic drug:  Polyethyl Glycol-Propyl Glycol  Apply 1 drop to eye daily as needed (for dry eyes).        Discharge Instructions: To [facility] for further management.  Signed: Letha Cape 11/02/2013, 12:46 PM

## 2013-11-02 NOTE — Progress Notes (Signed)
Discussed PICC insertion with nephrologist, Kidney Donor MD at [facility] with MD recommending transfer of pt to [facility] with monitoring of medical issues with nephrologist to monitor. PICC not to be inserted at this time with further actions taken to transfer to [facility].  Discussed plan of care with Adrianna, Living Donor Coordinator to move pt to Dr. Lowell Guitar as attending MD at [facility].

## 2013-11-02 NOTE — Progress Notes (Signed)
I have seen and examined the patient and agree with the assessment and plans.  Clinically, she is minimally improving.  Will check abd xray tomorrow to see how the contrast is progressing.  Will start TNA and continue NG.  She may need a SBFT  Jaja Switalski A. [name]  MD, FACS

## 2013-11-15 ENCOUNTER — Telehealth (INDEPENDENT_AMBULATORY_CARE_PROVIDER_SITE_OTHER): Payer: Self-pay

## 2013-11-15 NOTE — Telephone Encounter (Signed)
This was sent to me: fyi christy.

## 2013-11-15 NOTE — Telephone Encounter (Signed)
Pt's insurance company calling for peer-to-peer with Dr. Derrell Lolling.  Please call at your convenience.

## 2013-11-17 ENCOUNTER — Encounter (INDEPENDENT_AMBULATORY_CARE_PROVIDER_SITE_OTHER): Payer: Self-pay | Admitting: General Surgery

## 2013-11-17 ENCOUNTER — Ambulatory Visit (INDEPENDENT_AMBULATORY_CARE_PROVIDER_SITE_OTHER): Payer: 59 | Admitting: General Surgery

## 2013-11-17 VITALS — BP 122/62 | HR 65 | Temp 98.0°F | Resp 18 | Ht 64.0 in | Wt 114.0 lb

## 2013-11-17 DIAGNOSIS — Z8719 Personal history of other diseases of the digestive system: Secondary | ICD-10-CM

## 2013-11-17 NOTE — Progress Notes (Signed)
Subjective:     Patient ID: Jane Buckley, female   DOB: [birth year], 52 y.o.   MRN: [id number]  HPI The patient is a 52 year old femalewho previously underwent an exploratory laparoscopy for a small bowel structure lysed of adhesions. Patient represented to the ER and was admitted to the hospital secondary to a second bowel obstruction. The patient was subsequent transferred to [facility] secondary to her previous kidney transplantation and for observation by the transplant nephrologist. Patient subsequently recovered from her bowel function after being treated nonoperatively. She states at this time she is transitioned from a liquid diet to a soft diet. She is concerned about becoming obstructed in the future. She says she has not had a bowel movement in approximately 16 days.  She does say she has been taking a liquid diet as well assured at this time.   Review of Systems  Constitutional: Negative.   HENT: Negative.   Cardiovascular: Negative.   Gastrointestinal: Negative.  Negative for abdominal distention.  Neurological: Negative.   All other systems reviewed and are negative.       Objective:   Physical Exam  Constitutional: She is oriented to person, place, and time. She appears well-developed and well-nourished.  HENT:  Head: Normocephalic and atraumatic.  Eyes: Conjunctivae are normal. Pupils are equal, round, and reactive to light.  Neck: Normal range of motion. Neck supple.  Cardiovascular: Normal rate, regular rhythm and normal heart sounds.   Pulmonary/Chest: Effort normal and breath sounds normal.  Abdominal: Soft. Bowel sounds are normal. She exhibits no distension. There is no tenderness. There is no rebound and no guarding.  Musculoskeletal: Normal range of motion.  Neurological: She is alert and oriented to person, place, and time.       Assessment:     52 year old female status post SBO treated conservatively     Plan:     1. Her to increase  her fiber/Metamucil as well as a stool softener or milk of magnesia to assist with her bowel function. The patient has decreased her pain medication as much as possible. 2. I discussed with her the importance of recognizing the ability not tolerate by mouth and follow up either with [place] or the ER should it be necessary. She didn't come back to [facility] or [facility] for any further treatment secondary to her kidney transplant. 3. Patient follow up as needed

## 2014-02-01 ENCOUNTER — Other Ambulatory Visit: Payer: Self-pay

## 2014-02-01 DIAGNOSIS — Z1231 Encounter for screening mammogram for malignant neoplasm of breast: Secondary | ICD-10-CM

## 2014-03-09 ENCOUNTER — Ambulatory Visit: Payer: 59

## 2014-03-12 ENCOUNTER — Ambulatory Visit: Payer: 59

## 2014-05-03 ENCOUNTER — Ambulatory Visit
Admission: RE | Admit: 2014-05-03 | Discharge: 2014-05-03 | Disposition: A | Discharge status: Home / Self Care | Payer: 59 | Source: Ambulatory Visit

## 2014-05-03 ENCOUNTER — Encounter (INDEPENDENT_AMBULATORY_CARE_PROVIDER_SITE_OTHER): Payer: Self-pay

## 2014-05-03 DIAGNOSIS — Z1231 Encounter for screening mammogram for malignant neoplasm of breast: Secondary | ICD-10-CM

## 2015-02-17 ENCOUNTER — Emergency Department (HOSPITAL_COMMUNITY)
Admission: EM | Admit: 2015-02-17 | Discharge: 2015-02-17 | Disposition: A | Discharge status: Home / Self Care | Payer: 59 | Attending: Emergency Medicine | Admitting: Emergency Medicine

## 2015-02-17 ENCOUNTER — Encounter (HOSPITAL_COMMUNITY): Payer: Self-pay | Admitting: Cardiology

## 2015-02-17 DIAGNOSIS — R220 Localized swelling, mass and lump, head: Secondary | ICD-10-CM | POA: Diagnosis present

## 2015-02-17 DIAGNOSIS — Z7982 Long term (current) use of aspirin: Secondary | ICD-10-CM | POA: Insufficient documentation

## 2015-02-17 DIAGNOSIS — Z87891 Personal history of nicotine dependence: Secondary | ICD-10-CM | POA: Insufficient documentation

## 2015-02-17 DIAGNOSIS — T7840XA Allergy, unspecified, initial encounter: Secondary | ICD-10-CM | POA: Insufficient documentation

## 2015-02-17 DIAGNOSIS — Z8719 Personal history of other diseases of the digestive system: Secondary | ICD-10-CM | POA: Diagnosis not present

## 2015-02-17 DIAGNOSIS — T375X5A Adverse effect of antiviral drugs, initial encounter: Secondary | ICD-10-CM | POA: Insufficient documentation

## 2015-02-17 DIAGNOSIS — Z79899 Other long term (current) drug therapy: Secondary | ICD-10-CM | POA: Diagnosis not present

## 2015-02-17 MED ORDER — DEXAMETHASONE SODIUM PHOSPHATE 10 MG/ML IJ SOLN
10.0000 mg | Freq: Once | INTRAMUSCULAR | Status: AC
  Administered 2015-02-17: 10 mg via INTRAMUSCULAR
  Filled 2015-02-17: qty 1

## 2015-02-17 MED ORDER — PREDNISONE 10 MG PO TABS: ORAL_TABLET | ORAL | Status: AC

## 2015-02-17 MED ORDER — FAMOTIDINE 20 MG PO TABS: 20.0000 mg | ORAL_TABLET | Freq: Two times a day (BID) | ORAL | Status: AC

## 2015-02-17 MED ORDER — DIPHENHYDRAMINE HCL 25 MG PO TABS: 25.0000 mg | ORAL_TABLET | Freq: Four times a day (QID) | ORAL | Status: AC

## 2015-02-17 NOTE — ED Notes (Signed)
Pt reports she was given valacyclovir for a cold sore about 2 days ago. Reports she has had some swelling to her lips. States she took the medication again this morning.

## 2015-02-17 NOTE — ED Provider Notes (Signed)
CSN: [id number]     Arrival date & time 02/17/15  1351 History  This chart was scribed for non-physician practitioner, Lottie Mussel, PA-C, working with Samuel Jester, DO, by Ronney Lion, ED Scribe. This patient was seen in room TR06C/TR06C and the patient's care was started at 2:39 PM.    Chief Complaint  Patient presents with  . Medication Reaction   The history is provided by the patient. No language interpreter was used.     HPI Comments: Jane Buckley is a 54 y.o. female with a history of IBS who presents to the Emergency Department complaining of possible medication reaction (lip swelling) to Valacyclovir that began today. Patient reports she had a cold sore 6 days ago, put Abreva and Nivea on it, and had associated throbbing with the cold sore the day after. Patient then took Valacyclovir and Vaseline twice a day since 5 days ago. She reports she felt pressure on her lips last night and woke up today with her lips swollen, tight, and chapped, and throbbing "like a heartbeat" every time she pressed her lips together. She took Valacyclovir once today, and felt like her throat was closing, with associated tingling inside her mouth. She has never had these symptoms before. Patient applied cocoa butter and Vaseline with minimal relief. Before this episode, patient last took Valacyclovir several years ago. She denies a history of DM. Patient has known allergies to Sulfa drugs. Patient denies any other new products to the lips. She denies any new foods that would've caused the reaction. No fever, chills. No lesions of her oral mucosa.   Past Medical History  Diagnosis Date  . IBS (irritable bowel syndrome)    Past Surgical History  Procedure Laterality Date  . 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 Abdominal hysterectomy  1995  . Tubal ligation  1993  . Lumbar disc surgery  2000's  . Laparotomy N/A 08/23/2013    Procedure: EXPLORATORY LAPAROTOMY, LYSIS OF ADHESIONS;  Surgeon: Axel Filler, MD;  Location: [facility] OR;  Service: General;  Laterality: N/A;   Family History  Problem Relation Age of Onset  . Cancer Father   . Heart disease Mother    History  Substance Use Topics  . Smoking status: Former Smoker -- 0.25 packs/day for .5 years    Types: Cigarettes    Quit date: 12/28/1980  . Smokeless tobacco: Never Used  . Alcohol Use: 0.6 oz/week    1 Glasses of wine per week   OB History    No data available     Review of Systems  Constitutional: Negative for fever and chills.  HENT: Positive for facial swelling. Negative for congestion, sore throat and trouble swallowing.   Respiratory: Negative for choking.       Allergies  Bactrim and Sulfa antibiotics  Home Medications   Prior to Admission medications   Medication Sig Start Date End Date Taking? Authorizing Provider  aspirin EC 81 MG tablet Take 81 mg by mouth daily.    Historical Provider, MD  Calcium-Magnesium-Zinc [phone] MG TABS Take 1 tablet by mouth 2 (two) times daily.     Historical Provider, MD  cholecalciferol (VITAMIN D) 1000 UNITS tablet Take 1,000 Units by mouth daily.    Historical Provider, MD  HYDROcodone-acetaminophen (NORCO/VICODIN) 5-325 MG per tablet Take 1 tablet by mouth every 6 (six) hours as needed for pain.    Historical Provider, MD  Multiple Vitamin (MULTIVITAMIN) tablet Take 1 tablet by mouth daily.    Historical  Provider, MD  Polyethyl Glycol-Propyl Glycol (SYSTANE) 0.4-0.3 % SOLN Apply 1 drop to eye daily as needed (for dry eyes).    Historical Provider, MD  polyethylene glycol (MIRALAX / GLYCOLAX) packet Take 17 g by mouth daily as needed (for constipation).    Historical Provider, MD   BP 123/73 mmHg  Pulse 85  Temp(Src) 98.3 F (36.8 C) (Oral)  Resp 18  Ht 5' 4.5" (1.638 m)  Wt 125 lb (56.7 kg)  BMI 21.13 kg/m2  SpO2 99%  LMP 08/20/1992 Physical Exam  Constitutional: She is oriented to person, place, and time. She appears well-developed and well-nourished. No  distress.  HENT:  Head: Normocephalic and atraumatic.  Nose: Nose normal.  Mouth/Throat: Oropharynx is clear and moist. No oropharyngeal exudate.  Swelling and erythema noted to bilateral lips with tiny papules. No vesicles. No drainage or crusting. No oral mucosal rash.   Eyes: Conjunctivae and EOM are normal. Pupils are equal, round, and reactive to light.  Neck: Normal range of motion. Neck supple. No tracheal deviation present.  Cardiovascular: Normal rate.   Pulmonary/Chest: Effort normal and breath sounds normal. No respiratory distress. She has no wheezes. She has no rales.  No stridor  Musculoskeletal: Normal range of motion.  Neurological: She is alert and oriented to person, place, and time.  Skin: Skin is warm and dry.  Psychiatric: She has a normal mood and affect. Her behavior is normal.  Nursing note and vitals reviewed.   ED Course  Procedures (including critical care time)  DIAGNOSTIC STUDIES: Oxygen Saturation is 99% on room air, normal by my interpretation.    COORDINATION OF CARE: 2:45 PM - Discussed treatment plan with pt at bedside which includes steroid medications, Benadryl, and Pepcid, and pt agreed to plan.   Medications  dexamethasone (DECADRON) injection 10 mg (10 mg Intramuscular Given 02/17/15 1502)    MDM   Final diagnoses:  Allergic reaction, initial encounter  Swelling of both lips   Patient with swelling, redness, burning, itching of the lips. States started after she has been taking Valtrex for several days. She has not taken anything for her symptoms at home. She states she did have some itching and tightness in her throat which has now resolved. Her exam is most consistent with allergic reaction. Unsure of diagnosis from the medication or from a topical product versus food. Instructed her to stop using any topical products except for Vaseline. Stop Valtrex. Patient received Decadron 10 mg IM in emergency department for reaction. No evidence of  angioedema on exam. She'll be discharged home with Benadryl, Pepcid, prednisone. Follow-up with primary care doctor.  Filed Vitals:   02/17/15 1356 02/17/15 1512  BP: 123/73 130/74  Pulse: 85 71  Temp: 98.3 F (36.8 C) 97.6 F (36.4 C)  TempSrc: Oral Oral  Resp: 18 16  Height: 5' 4.5" (1.638 m)   Weight: 125 lb (56.7 kg)   SpO2: 99% 100%    I personally performed the services described in this documentation, which was scribed in my presence. The recorded information has been reviewed and is accurate.     Lottie Musselatyana A Naylene Foell, PA-C 02/17/15 1552  Samuel JesterKathleen McManus, DO 02/19/15 [phone]

## 2015-02-17 NOTE — Discharge Instructions (Signed)
Benadryl and pepcid for the reaction. Prednisone as prescribed until all gone starting tomorrow. Follow up with your doctor for recheck. Return if worsening symptoms.   Drug Allergy Allergic reactions to medicines are common. Some allergic reactions are mild. A delayed type of drug allergy that occurs 1 week or more after exposure to a medicine or vaccine is called serum sickness. A life-threatening, sudden (acute) allergic reaction that involves the whole body is called anaphylaxis. CAUSES  "True" drug allergies occur when there is an allergic reaction to a medicine. This is caused by overactivity of the immune system. First, the body becomes sensitized. The immune system is triggered by your first exposure to the medicine. Following this first exposure, future exposure to the same medicine may be life-threatening. Almost any medicine can cause an allergic reaction. Common ones are:  Penicillin.  Sulfonamides (sulfa drugs).  Local anesthetics.  X-ray dyes that contain iodine. SYMPTOMS  Common symptoms of a minor allergic reaction are:  Swelling around the mouth.  An itchy red rash or hives.  Vomiting or diarrhea. Anaphylaxis can cause swelling of the mouth and throat. This makes it difficult to breathe and swallow. Severe reactions can be fatal within seconds, even after exposure to only a trace amount of the drug that causes the reaction. HOME CARE INSTRUCTIONS   If you are unsure of what caused your reaction, keep a diary of foods and medicines used. Include the symptoms that followed. Avoid anything that causes reactions.  You may want to follow up with an allergy specialist after the reaction has cleared in order to be tested to confirm the allergy. It is important to confirm that your reaction is an allergy, not just a side effect to the medicine. If you have a true allergy to a medicine, this may prevent that medicine and related medicines from being given to you when you are very  ill.  If you have hives or a rash:  Take medicines as directed by your caregiver.  You may use an over-the-counter antihistamine (diphenhydramine) as needed.  Apply cold compresses to the skin or take baths in cool water. Avoid hot baths or showers.  If you are severely allergic:  Continuous observation after a severe reaction may be needed. Hospitalization is often required.  Wear a medical alert bracelet or necklace stating your allergy.  You and your family must learn how to use an anaphylaxis kit or give an epinephrine injection to temporarily treat an emergency allergic reaction. If you have had a severe reaction, always carry your epinephrine injection or anaphylaxis kit with you. This can be lifesaving if you have a severe reaction.  Do not drive or perform tasks after treatment until the medicines used to treat your reaction have worn off, or until your caregiver says it is okay. SEEK MEDICAL CARE IF:   You think you had an allergic reaction. Symptoms usually start within 30 minutes after exposure.  Symptoms are getting worse rather than better.  You develop new symptoms.  The symptoms that brought you to your caregiver return. SEEK IMMEDIATE MEDICAL CARE IF:   You have swelling of the mouth, difficulty breathing, or wheezing.  You have a tight feeling in your chest or throat.  You develop hives, swelling, or itching all over your body.  You develop severe vomiting or diarrhea.  You feel faint or pass out. This is an emergency. Use your epinephrine injection or anaphylaxis kit as you have been instructed. Call for emergency medical help. Even  if you improve after the injection, you need to be examined at a hospital emergency department. MAKE SURE YOU:   Understand these instructions.  Will watch your condition.  Will get help right away if you are not doing well or get worse. Document Released: 12/14/2005 Document Revised: 03/07/2012 Document Reviewed:  05/20/2011 [facility] Patient Information 2015 [place], [place]. This information is not intended to replace advice given to you by your health care provider. Make sure you discuss any questions you have with your health care provider.

## 2015-02-17 NOTE — ED Notes (Signed)
Declined W/C at D/C and was escorted to lobby by RN. 

## 2015-02-17 NOTE — ED Notes (Signed)
Pt reports taking valacyclovir 500mg   . Last dose this AM.  Rx by Laurel DimmerV. Haygood MD

## 2015-02-21 ENCOUNTER — Telehealth: Payer: Self-pay | Admitting: *Deleted

## 2015-02-21 NOTE — Telephone Encounter (Signed)
Pt called questioning prescribed Pepcid because she wasn't having stomach problems when she came in.  NCM offered reason of because pt has hx of IBS, the Pepcid prescription was prophylactic.  Pt states she will just get over the counter.

## 2015-08-27 ENCOUNTER — Other Ambulatory Visit: Payer: Self-pay

## 2015-08-27 DIAGNOSIS — Z1231 Encounter for screening mammogram for malignant neoplasm of breast: Secondary | ICD-10-CM

## 2015-09-23 ENCOUNTER — Ambulatory Visit
Admission: RE | Admit: 2015-09-23 | Discharge: 2015-09-23 | Disposition: A | Discharge status: Home / Self Care | Payer: 59 | Source: Ambulatory Visit

## 2015-09-23 DIAGNOSIS — Z1231 Encounter for screening mammogram for malignant neoplasm of breast: Secondary | ICD-10-CM

## 2016-09-02 ENCOUNTER — Ambulatory Visit (HOSPITAL_COMMUNITY)
Admission: EM | Admit: 2016-09-02 | Discharge: 2016-09-02 | Disposition: A | Discharge status: Home / Self Care | Payer: 59 | Attending: Internal Medicine | Admitting: Internal Medicine

## 2016-09-02 ENCOUNTER — Encounter (HOSPITAL_COMMUNITY): Payer: Self-pay | Admitting: Emergency Medicine

## 2016-09-02 DIAGNOSIS — M5413 Radiculopathy, cervicothoracic region: Secondary | ICD-10-CM | POA: Diagnosis not present

## 2016-09-02 NOTE — ED Triage Notes (Signed)
The [facility] with a complaint of a cough with congestion and associated chest wall pain x 6 days.   The patient also complained of a new onset of right arm and shoulder pain that started 2 days ago. The patient denied any known injury and stated that the pain was worse with use.

## 2016-09-03 ENCOUNTER — Other Ambulatory Visit: Payer: Self-pay | Admitting: Obstetrics and Gynecology

## 2016-09-03 DIAGNOSIS — Z1231 Encounter for screening mammogram for malignant neoplasm of breast: Secondary | ICD-10-CM

## 2016-09-03 NOTE — ED Provider Notes (Signed)
CSN: [id number]     Arrival date & time 09/02/16  1750 History   First MD Initiated Contact with Patient 09/02/16 1853     Chief Complaint  Patient presents with  . Cough  . Arm Pain   (Consider location/radiation/quality/duration/timing/severity/associated sxs/prior Treatment) HPI Patient is a 55 year old female that states that she has developed a numbness and burning in both of her upper arms to stop approximately at the elbow. Symptoms are been present for a couple weeks now. She states that she has had no other symptoms. No trauma. 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 She denies any previous symptoms of this nature. She states that she was at the grocery store yesterday pushing a cart and had discomfort come on her. It was very sharp and causes numbness. He states that she is better at this time. Past Medical History:  Diagnosis Date  . IBS (irritable bowel syndrome)    Past Surgical History:  Procedure Laterality Date  . ABDOMINAL HYSTERECTOMY  1995  . 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 LAPAROTOMY N/A 08/23/2013   Procedure: EXPLORATORY LAPAROTOMY, LYSIS OF ADHESIONS;  Surgeon: Axel FillerArmando Ramirez, MD;  Location: [facility] OR;  Service: General;  Laterality: N/A;  . LUMBAR DISC SURGERY  2000's  . TUBAL LIGATION  1993   Family History  Problem Relation Age of Onset  . Cancer Father   . Heart disease Mother    Social History  Substance Use Topics  . Smoking status: Former Smoker    Packs/day: 0.25    Years: 0.50    Types: Cigarettes    Quit date: 12/28/1980  . Smokeless tobacco: Never Used  . Alcohol use 0.6 oz/week    1 Glasses of wine per week   OB History    No data available     Review of Systems  Denies: HEADACHE, NAUSEA, ABDOMINAL PAIN, CHEST PAIN, CONGESTION, DYSURIA, SHORTNESS OF BREATH  Allergies  Bactrim [sulfamethoxazole-trimethoprim] and Sulfa antibiotics  Home Medications   Prior to Admission medications   Medication Sig Start Date End Date Taking? Authorizing  Provider  BIOTIN PO Take by mouth.   Yes Historical Provider, MD  Multiple Vitamin (MULTIVITAMIN) tablet Take 1 tablet by mouth daily.   Yes Historical Provider, MD  aspirin EC 81 MG tablet Take 81 mg by mouth daily.    Historical Provider, MD  Calcium-Magnesium-Zinc [phone] MG TABS Take 1 tablet by mouth 2 (two) times daily.     Historical Provider, MD  cholecalciferol (VITAMIN D) 1000 UNITS tablet Take 1,000 Units by mouth daily.    Historical Provider, MD  diphenhydrAMINE (BENADRYL) 25 MG tablet Take 1 tablet (25 mg total) by mouth every 6 (six) hours. 02/17/15   Tatyana Kirichenko, PA-C  famotidine (PEPCID) 20 MG tablet Take 1 tablet (20 mg total) by mouth 2 (two) times daily. 02/17/15   Tatyana Kirichenko, PA-C  HYDROcodone-acetaminophen (NORCO/VICODIN) 5-325 MG per tablet Take 1 tablet by mouth every 6 (six) hours as needed for pain.    Historical Provider, MD  Polyethyl Glycol-Propyl Glycol (SYSTANE) 0.4-0.3 % SOLN Apply 1 drop to eye daily as needed (for dry eyes).    Historical Provider, MD  polyethylene glycol (MIRALAX / GLYCOLAX) packet Take 17 g by mouth daily as needed (for constipation).    Historical Provider, MD  predniSONE (DELTASONE) 10 MG tablet Take 5 tab day 1, take 4 tab day 2, take 3 tab day 3, take 2 tab day 4, and take 1 tab day 5 02/17/15  Tatyana Kirichenko, PA-C   Meds Ordered and Administered this Visit  Medications - No data to display  BP 123/78 (BP Location: Right Arm)   Pulse 71   Temp 97.9 F (36.6 C) (Oral)   Resp 18   LMP 08/20/1992   SpO2 99%  No data found.   Physical Exam NURSES NOTES AND VITAL SIGNS REVIEWED. CONSTITUTIONAL: Well developed, well nourished, no acute distress HEENT: normocephalic, atraumatic EYES: Conjunctiva normal NECK:normal ROM, supple, no adenopathy PULMONARY:No respiratory distress, normal effort ABDOMINAL: Soft, ND, NT BS+, No CVAT MUSCULOSKELETAL: Normal ROM of all extremities,  SKIN: warm and dry without  rash PSYCHIATRIC: Mood and affect, behavior are normal  Urgent Care Course   Clinical Course    Procedures (including critical care time)  Labs Review Labs Reviewed - No data to display  Imaging Review No results found.   Visual Acuity Review  Right Eye Distance:   Left Eye Distance:   Bilateral Distance:    Right Eye Near:   Left Eye Near:    Bilateral Near:       Patient is advised that the distribution of her pain the most likely origin of some type of cervical radiculopathy. She may need to see her primary care provider specialty services for further evaluation and treatment. Otherwise she can treat symptomatically with ibuprofen firm support for neck while sleeping. Frequent breaks while working at the computer.  MDM   1. Radiculopathy of cervicothoracic region         Tharon Aquas, [place] 09/03/16 1229

## 2016-09-23 ENCOUNTER — Ambulatory Visit
Admission: RE | Admit: 2016-09-23 | Discharge: 2016-09-23 | Disposition: A | Discharge status: Home / Self Care | Payer: 59 | Source: Ambulatory Visit | Attending: Obstetrics and Gynecology | Admitting: Obstetrics and Gynecology

## 2016-09-23 DIAGNOSIS — Z1231 Encounter for screening mammogram for malignant neoplasm of breast: Secondary | ICD-10-CM

## 2017-09-30 ENCOUNTER — Other Ambulatory Visit: Payer: Self-pay | Admitting: Obstetrics and Gynecology

## 2017-09-30 DIAGNOSIS — Z1231 Encounter for screening mammogram for malignant neoplasm of breast: Secondary | ICD-10-CM

## 2017-10-07 ENCOUNTER — Ambulatory Visit: Payer: 59

## 2017-10-13 ENCOUNTER — Inpatient Hospital Stay: Admission: RE | Admit: 2017-10-13 | Payer: 59 | Source: Ambulatory Visit

## 2017-10-20 ENCOUNTER — Ambulatory Visit
Admission: RE | Admit: 2017-10-20 | Discharge: 2017-10-20 | Disposition: A | Discharge status: Home / Self Care | Payer: 59 | Source: Ambulatory Visit | Attending: Obstetrics and Gynecology | Admitting: Obstetrics and Gynecology

## 2017-10-20 DIAGNOSIS — Z1231 Encounter for screening mammogram for malignant neoplasm of breast: Secondary | ICD-10-CM

## 2018-08-08 ENCOUNTER — Inpatient Hospital Stay (HOSPITAL_COMMUNITY)
Admission: EM | Admit: 2018-08-08 | Discharge: 2018-08-12 | DRG: 390 | Disposition: A | Discharge status: Home / Self Care | Payer: 59 | Attending: General Surgery | Admitting: General Surgery

## 2018-08-08 ENCOUNTER — Other Ambulatory Visit: Payer: Self-pay

## 2018-08-08 DIAGNOSIS — Z79899 Other long term (current) drug therapy: Secondary | ICD-10-CM

## 2018-08-08 DIAGNOSIS — Z4659 Encounter for fitting and adjustment of other gastrointestinal appliance and device: Secondary | ICD-10-CM

## 2018-08-08 DIAGNOSIS — Z882 Allergy status to sulfonamides status: Secondary | ICD-10-CM

## 2018-08-08 DIAGNOSIS — Z905 Acquired absence of kidney: Secondary | ICD-10-CM

## 2018-08-08 DIAGNOSIS — Z7982 Long term (current) use of aspirin: Secondary | ICD-10-CM

## 2018-08-08 DIAGNOSIS — K56609 Unspecified intestinal obstruction, unspecified as to partial versus complete obstruction: Secondary | ICD-10-CM | POA: Diagnosis not present

## 2018-08-08 DIAGNOSIS — Z87891 Personal history of nicotine dependence: Secondary | ICD-10-CM

## 2018-08-08 DIAGNOSIS — Z0189 Encounter for other specified special examinations: Secondary | ICD-10-CM

## 2018-08-08 DIAGNOSIS — Z881 Allergy status to other antibiotic agents status: Secondary | ICD-10-CM

## 2018-08-08 DIAGNOSIS — Z524 Kidney donor: Secondary | ICD-10-CM

## 2018-08-08 DIAGNOSIS — I10 Essential (primary) hypertension: Secondary | ICD-10-CM | POA: Diagnosis present

## 2018-08-08 HISTORY — DX: Essential (primary) hypertension: I10

## 2018-08-08 LAB — COMPREHENSIVE METABOLIC PANEL
ALBUMIN: 4.3 g/dL (ref 3.5–5.0)
ALK PHOS: 75 U/L (ref 38–126)
ALT: 18 U/L (ref 0–44)
ANION GAP: 10 (ref 5–15)
AST: 27 U/L (ref 15–41)
BUN: 14 mg/dL (ref 6–20)
CALCIUM: 9.5 mg/dL (ref 8.9–10.3)
CO2: 28 mmol/L (ref 22–32)
Chloride: 99 mmol/L (ref 98–111)
Creatinine, Ser: 1.14 mg/dL — ABNORMAL HIGH (ref 0.44–1.00)
GFR calc Af Amer: >60 mL/min (ref >60)
GFR calc non Af Amer: 54 mL/min — ABNORMAL LOW (ref >60)
GLUCOSE: 124 mg/dL — AB (ref 70–99)
Potassium: 4.4 mmol/L (ref 3.5–5.1)
Sodium: 137 mmol/L (ref 135–145)
TOTAL PROTEIN: 7.7 g/dL (ref 6.5–8.1)
Total Bilirubin: 0.9 mg/dL (ref 0.3–1.2)

## 2018-08-08 LAB — I-STAT BETA HCG BLOOD, ED (MC, WL, AP ONLY)

## 2018-08-08 LAB — LIPASE, BLOOD: Lipase: 35 U/L (ref 11–51)

## 2018-08-08 LAB — CBC
HCT: 41 % (ref 36.0–46.0)
Hemoglobin: 13.6 g/dL (ref 12.0–15.0)
MCH: 31.5 pg (ref 26.0–34.0)
MCHC: 33.2 g/dL (ref 30.0–36.0)
MCV: 94.9 fL (ref 78.0–100.0)
Platelets: 186 10*3/uL (ref 150–400)
RBC: 4.32 MIL/uL (ref 3.87–5.11)
RDW: 12 % (ref 11.5–15.5)
WBC: 7.1 10*3/uL (ref 4.0–10.5)

## 2018-08-08 MED ORDER — ONDANSETRON 4 MG PO TBDP
4.0000 mg | ORAL_TABLET | Freq: Once | ORAL | Status: AC | PRN
  Administered 2018-08-08: 4 mg via ORAL
  Filled 2018-08-08: qty 1

## 2018-08-08 NOTE — ED Triage Notes (Signed)
Patient c/o severe abd pain that began today. States that it feels like it did when she had a bowel obx 4 years ago.

## 2018-08-09 ENCOUNTER — Emergency Department (HOSPITAL_COMMUNITY): Payer: 59

## 2018-08-09 ENCOUNTER — Inpatient Hospital Stay (HOSPITAL_COMMUNITY): Payer: 59

## 2018-08-09 ENCOUNTER — Encounter (HOSPITAL_COMMUNITY): Payer: Self-pay | Admitting: Radiology

## 2018-08-09 DIAGNOSIS — Z881 Allergy status to other antibiotic agents status: Secondary | ICD-10-CM | POA: Diagnosis not present

## 2018-08-09 DIAGNOSIS — I10 Essential (primary) hypertension: Secondary | ICD-10-CM | POA: Diagnosis present

## 2018-08-09 DIAGNOSIS — Z7982 Long term (current) use of aspirin: Secondary | ICD-10-CM | POA: Diagnosis not present

## 2018-08-09 DIAGNOSIS — Z87891 Personal history of nicotine dependence: Secondary | ICD-10-CM | POA: Diagnosis not present

## 2018-08-09 DIAGNOSIS — Z79899 Other long term (current) drug therapy: Secondary | ICD-10-CM | POA: Diagnosis not present

## 2018-08-09 DIAGNOSIS — Z524 Kidney donor: Secondary | ICD-10-CM | POA: Diagnosis not present

## 2018-08-09 DIAGNOSIS — K56609 Unspecified intestinal obstruction, unspecified as to partial versus complete obstruction: Secondary | ICD-10-CM | POA: Diagnosis present

## 2018-08-09 DIAGNOSIS — Z905 Acquired absence of kidney: Secondary | ICD-10-CM | POA: Diagnosis not present

## 2018-08-09 DIAGNOSIS — Z882 Allergy status to sulfonamides status: Secondary | ICD-10-CM | POA: Diagnosis not present

## 2018-08-09 LAB — URINALYSIS, ROUTINE W REFLEX MICROSCOPIC
Bilirubin Urine: NEGATIVE
GLUCOSE, UA: NEGATIVE mg/dL
Hgb urine dipstick: NEGATIVE
Ketones, ur: 5 mg/dL — AB
LEUKOCYTES UA: NEGATIVE
Nitrite: NEGATIVE
PH: 8 (ref 5.0–8.0)
PROTEIN: NEGATIVE mg/dL
Specific Gravity, Urine: >1.046 — ABNORMAL HIGH (ref 1.005–1.030)

## 2018-08-09 MED ORDER — ONDANSETRON HCL 4 MG/2ML IJ SOLN
4.0000 mg | Freq: Once | INTRAMUSCULAR | Status: AC
  Administered 2018-08-09: 4 mg via INTRAVENOUS
  Filled 2018-08-09: qty 2

## 2018-08-09 MED ORDER — LIDOCAINE VISCOUS HCL 2 % MT SOLN
OROMUCOSAL | Status: AC
  Administered 2018-08-09: 6 mL via OROMUCOSAL
  Filled 2018-08-09: qty 15

## 2018-08-09 MED ORDER — ENOXAPARIN SODIUM 40 MG/0.4ML ~~LOC~~ SOLN: 40.0000 mg | SUBCUTANEOUS | Status: DC

## 2018-08-09 MED ORDER — HYDRALAZINE HCL 20 MG/ML IJ SOLN: 10.0000 mg | INTRAMUSCULAR | Status: DC | PRN

## 2018-08-09 MED ORDER — IOHEXOL 300 MG/ML  SOLN
100.0000 mL | Freq: Once | INTRAMUSCULAR | Status: AC | PRN
  Administered 2018-08-09: 100 mL via INTRAVENOUS

## 2018-08-09 MED ORDER — MORPHINE SULFATE (PF) 4 MG/ML IV SOLN
4.0000 mg | Freq: Once | INTRAVENOUS | Status: AC
  Administered 2018-08-09: 4 mg via INTRAVENOUS
  Filled 2018-08-09: qty 1

## 2018-08-09 MED ORDER — SODIUM CHLORIDE 0.9 % IV SOLN
INTRAVENOUS | Status: DC
  Administered 2018-08-09 – 2018-08-12 (×6): via INTRAVENOUS

## 2018-08-09 MED ORDER — MORPHINE SULFATE (PF) 2 MG/ML IV SOLN
2.0000 mg | INTRAVENOUS | Status: DC | PRN
  Administered 2018-08-09 (×2): 4 mg via INTRAVENOUS
  Administered 2018-08-09 – 2018-08-10 (×3): 2 mg via INTRAVENOUS
  Filled 2018-08-09: qty 1
  Filled 2018-08-09: qty 2
  Filled 2018-08-09 (×2): qty 1
  Filled 2018-08-09: qty 2
  Filled 2018-08-09: qty 1

## 2018-08-09 MED ORDER — FAMOTIDINE IN NACL 20-0.9 MG/50ML-% IV SOLN
20.0000 mg | INTRAVENOUS | Status: DC
  Administered 2018-08-09 – 2018-08-11 (×3): 20 mg via INTRAVENOUS
  Filled 2018-08-09 (×3): qty 50

## 2018-08-09 MED ORDER — IOHEXOL 240 MG/ML SOLN: 100.0000 mL | Freq: Once | INTRAMUSCULAR | Status: DC | PRN

## 2018-08-09 MED ORDER — ENOXAPARIN SODIUM 40 MG/0.4ML ~~LOC~~ SOLN
40.0000 mg | Freq: Every day | SUBCUTANEOUS | Status: DC
  Administered 2018-08-09 – 2018-08-12 (×4): 40 mg via SUBCUTANEOUS
  Filled 2018-08-09 (×4): qty 0.4

## 2018-08-09 MED ORDER — PROMETHAZINE HCL 25 MG PO TABS
12.5000 mg | ORAL_TABLET | Freq: Once | ORAL | Status: AC
  Administered 2018-08-09: 12.5 mg via ORAL
  Filled 2018-08-09: qty 1

## 2018-08-09 MED ORDER — ACETAMINOPHEN 325 MG PO TABS: 650.0000 mg | ORAL_TABLET | Freq: Four times a day (QID) | ORAL | Status: DC | PRN

## 2018-08-09 MED ORDER — ONDANSETRON 4 MG PO TBDP
4.0000 mg | ORAL_TABLET | Freq: Four times a day (QID) | ORAL | Status: DC | PRN
  Administered 2018-08-11: 4 mg via ORAL
  Filled 2018-08-09: qty 1

## 2018-08-09 MED ORDER — DIPHENHYDRAMINE HCL 50 MG/ML IJ SOLN: 25.0000 mg | Freq: Four times a day (QID) | INTRAMUSCULAR | Status: DC | PRN

## 2018-08-09 MED ORDER — DIPHENHYDRAMINE HCL 25 MG PO CAPS: 25.0000 mg | ORAL_CAPSULE | Freq: Four times a day (QID) | ORAL | Status: DC | PRN

## 2018-08-09 MED ORDER — LIDOCAINE VISCOUS HCL 2 % MT SOLN
15.0000 mL | Freq: Once | OROMUCOSAL | Status: AC
  Administered 2018-08-09: 6 mL via OROMUCOSAL

## 2018-08-09 MED ORDER — ACETAMINOPHEN 650 MG RE SUPP: 650.0000 mg | Freq: Four times a day (QID) | RECTAL | Status: DC | PRN

## 2018-08-09 MED ORDER — ONDANSETRON HCL 4 MG/2ML IJ SOLN
4.0000 mg | Freq: Four times a day (QID) | INTRAMUSCULAR | Status: DC | PRN
  Administered 2018-08-09 – 2018-08-11 (×4): 4 mg via INTRAVENOUS
  Filled 2018-08-09 (×5): qty 2

## 2018-08-09 NOTE — ED Notes (Signed)
Pt taken to IR for NGT placement at this time

## 2018-08-09 NOTE — ED Provider Notes (Signed)
[facility] Provider Note   CSN: [id number] Arrival date & time: 08/08/18  2227     History   Chief Complaint Chief Complaint  Patient presents with  . Abdominal Pain    HPI Jane Buckley is a 57 y.o. female.  Patient with a history of previous bowel obstruction presents with abdominal pain that started around 2:00 this afternoon (08/08/18). The pain is diffuse. No fever. She reports nausea with vomiting. She feels the symptoms are the same as previous obstruction. No urinary symptoms, chest pain or SOB.  The history is provided by the patient. No language interpreter was used.  Abdominal Pain   Associated symptoms include nausea and vomiting. Pertinent negatives include fever.    Past Medical History:  Diagnosis Date  . IBS (irritable bowel syndrome)     Patient Active Problem List   Diagnosis Date Noted  . S/P laparotomy for adhesiolysis for SBO 08/26/2013  . Abdominal pain 08/21/2013  . Anemia 08/21/2013  . S/P hysterectomy with oophorectomy 02/07/2013  . Physical examination of potential organ donor 02/07/2013    Past Surgical History:  Procedure Laterality Date  . ABDOMINAL HYSTERECTOMY  1995  . 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 LAPAROTOMY N/A 08/23/2013   Procedure: EXPLORATORY LAPAROTOMY, LYSIS OF ADHESIONS;  Surgeon: Axel FillerArmando Ramirez, MD;  Location: [facility] OR;  Service: General;  Laterality: N/A;  . LUMBAR DISC SURGERY  2000's  . TUBAL LIGATION  1993     OB History   None      Home Medications    Prior to Admission medications   Medication Sig Start Date End Date Taking? Authorizing Provider  aspirin EC 81 MG tablet Take 81 mg by mouth daily.    [provider]  BIOTIN PO Take by mouth.    [provider]  Calcium-Magnesium-Zinc [phone] MG TABS Take 1 tablet by mouth 2 (two) times daily.     [provider]  cholecalciferol (VITAMIN D) 1000 UNITS tablet Take 1,000 Units by mouth daily.     [provider]  diphenhydrAMINE (BENADRYL) 25 MG tablet Take 1 tablet (25 mg total) by mouth every 6 (six) hours. 02/17/15   Kirichenko, Lemont Fillersatyana, PA-C  famotidine (PEPCID) 20 MG tablet Take 1 tablet (20 mg total) by mouth 2 (two) times daily. 02/17/15   Kirichenko, Lemont Fillersatyana, PA-C  HYDROcodone-acetaminophen (NORCO/VICODIN) 5-325 MG per tablet Take 1 tablet by mouth every 6 (six) hours as needed for pain.    [provider]  Multiple Vitamin (MULTIVITAMIN) tablet Take 1 tablet by mouth daily.    [provider]  Polyethyl Glycol-Propyl Glycol (SYSTANE) 0.4-0.3 % SOLN Apply 1 drop to eye daily as needed (for dry eyes).    [provider]  polyethylene glycol (MIRALAX / GLYCOLAX) packet Take 17 g by mouth daily as needed (for constipation).    [provider]  predniSONE (DELTASONE) 10 MG tablet Take 5 tab day 1, take 4 tab day 2, take 3 tab day 3, take 2 tab day 4, and take 1 tab day 5 02/17/15   Jaynie CrumbleKirichenko, Tatyana, PA-C    Family History Family History  Problem Relation Age of Onset  . Cancer Father   . Heart disease Mother     Social History Social History   Tobacco Use  . Smoking status: Former Smoker    Packs/day: 0.25    Years: 0.50    Pack years: 0.12    Types: Cigarettes    Last attempt  to quit: 12/28/1980    Years since quitting: 37.6  . Smokeless tobacco: Never Used  Substance Use Topics  . Alcohol use: Yes    Alcohol/week: 1.0 standard drinks    Types: 1 Glasses of wine per week  . Drug use: No     Allergies   Bactrim [sulfamethoxazole-trimethoprim] and Sulfa antibiotics   Review of Systems Review of Systems  Constitutional: Negative for chills and fever.  HENT: Negative.   Respiratory: Negative.   Cardiovascular: Negative.   Gastrointestinal: Positive for abdominal pain, nausea and vomiting.  Genitourinary: Negative.   Musculoskeletal: Negative.   Skin: Negative.   Neurological: Negative.      Physical  Exam Updated Vital Signs BP (!) 147/80   Pulse 67   Temp 98.6 F (37 C) (Oral)   Resp 18   Ht 5\' 4"  (1.626 m)   Wt 67.1 kg   LMP 08/20/1992   SpO2 96%   BMI 25.40 kg/m   Physical Exam  Constitutional: She appears well-developed and well-nourished.  HENT:  Head: Normocephalic.  Neck: Normal range of motion. Neck supple.  Cardiovascular: Normal rate and regular rhythm.  Pulmonary/Chest: Effort normal and breath sounds normal.  Abdominal: Soft. Bowel sounds are decreased. There is generalized tenderness. There is guarding. There is no rebound.  Musculoskeletal: Normal range of motion.  Neurological: She is alert. No cranial nerve deficit.  Skin: Skin is warm and dry. No rash noted.  Psychiatric: She has a normal mood and affect.     ED Treatments / Results  Labs (all labs ordered are listed, but only abnormal results are displayed) Labs Reviewed  COMPREHENSIVE METABOLIC PANEL - Abnormal; Notable for the following components:      Result Value   Glucose, Bld 124 (*)    Creatinine, Ser 1.14 (*)    GFR calc non Af Amer 54 (*)    All other components within normal limits  LIPASE, BLOOD  CBC  URINALYSIS, ROUTINE W REFLEX MICROSCOPIC  I-STAT BETA HCG BLOOD, ED ([facility], [facility], [facility] ONLY)    EKG None  Radiology No results found.  Procedures Procedures (including critical care time)  Medications Ordered in ED Medications  morphine 4 MG/ML injection 4 mg (has no administration in time range)  ondansetron (ZOFRAN) injection 4 mg (has no administration in time range)  ondansetron (ZOFRAN-ODT) disintegrating tablet 4 mg (4 mg Oral Given 08/08/18 2313)     Initial Impression / Assessment and Plan / ED Course  I have reviewed the triage vital signs and the nursing notes.  Pertinent labs & imaging results that were available during my care of the patient were reviewed by me and considered in my medical decision making (see chart for details).     Patient here with abdominal  pain similar to previous bowel obstruction (4 years ago), with nausea, vomiting. No fever.   Labs are reassuring. CT scan ordered for further evaluation.   Pain is controlled during wait for CT scan.   CT results c/w SBO, no identified transition point. Patient updated on results. Additional pain/nausea medication provided. NG tube ordered. General surgery paged.  Discussed with Mattie MarlinJessica Focht, PA with general Surgery who advises they will see and admit the patient.   Final Clinical Impressions(s) / ED Diagnoses   Final diagnoses:  None   1. SBO, recurrent  ED Discharge Orders    None       Danne HarborUpstill, Tayton Decaire, PA-C 08/09/18 0805    Palumbo, April, MD 08/09/18 2339

## 2018-08-09 NOTE — ED Notes (Signed)
Spoke with tommy, pharmacist , ok to put order back in for 40 mg daily lovenox subq

## 2018-08-09 NOTE — H&P (Signed)
[facility] Surgery Consult/Admission Note  Jane Buckley [birth year]  [id number].    Requesting MD: Anderson Malta, PA-C Chief Complaint/Reason for Consult: SBO  HPI:   Pt is a 57 yo female with a hx of appendectomy, hysterectomy, kidney donor, Ex lap with LOA in 2014 for SBO who presented to the ED with complaints of abdominal pain, nausea, and vomiting that started yesterday. Pt states she began having generalized intermittent, crampy abdominal pain at work that progressively worsened to constant and severe. Associated nausea, vomiting and mild bloating. She tried pepsi without relief. Emesis was watery and brown. Small BM last night and small amount of flatus since arrival to the ED. She states this feels similar to her SBO in 2014 only less bloating. No fever, chills, CP, SOB, urinary symptoms, no melena. Last normal BM was 3 days ago. Pt does not take anticoagulation and only takes medicine for HTN.   CT scan showed Dilated fluid-filled mid small bowel loops in the abdomen and pelvis compatible with small bowel obstruction. Exact cause and transition point not visualized.  ROS:  Review of Systems  Constitutional: Negative for chills, diaphoresis and fever.  HENT: Negative for sore throat.   Respiratory: Negative for cough and shortness of breath.   Cardiovascular: Negative for chest pain.  Gastrointestinal: Positive for abdominal pain, nausea and vomiting. Negative for blood in stool, constipation and diarrhea.  Genitourinary: Negative for dysuria, hematuria and urgency.  Skin: Negative for rash.  Neurological: Negative for dizziness and loss of consciousness.  All other systems reviewed and are negative.    Family History  Problem Relation Age of Onset  . Cancer Father   . Heart disease Mother     Past Medical History:  Diagnosis Date  . Hypertension   . IBS (irritable bowel syndrome)     Past Surgical History:  Procedure Laterality Date  . ABDOMINAL  HYSTERECTOMY  1995  . 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 LAPAROTOMY N/A 08/23/2013   Procedure: EXPLORATORY LAPAROTOMY, LYSIS OF ADHESIONS;  Surgeon: Ralene Ok, MD;  Location: [place];  Service: General;  Laterality: N/A;  . LUMBAR [place] SURGERY  2000's  . TUBAL LIGATION  1993    Social History:  reports that she quit smoking about 37 years ago. Her smoking use included cigarettes. She has a 0.13 pack-year smoking history. She has never used smokeless tobacco. She reports that she drinks about 1.0 standard drinks of alcohol per week. She reports that she does not use drugs.  Allergies:  Allergies  Allergen Reactions  . Bactrim [Sulfamethoxazole-Trimethoprim] Shortness Of Breath  . Sulfa Antibiotics Shortness Of Breath     (Not in a hospital admission)  Blood pressure (!) 124/58, pulse 76, temperature 98.6 F (37 C), temperature source Oral, resp. rate 18, height 5' 4" (1.626 m), weight 67.1 kg, last menstrual period 08/20/1992, SpO2 99 %.  Physical Exam  Constitutional: She is oriented to person, place, and time. She appears well-developed and well-nourished.  Non-toxic appearance. She does not appear ill. No distress.  HENT:  Head: Normocephalic and atraumatic.  Nose: Nose normal.  Mouth/Throat: Uvula is midline, oropharynx is clear and moist and mucous membranes are normal. No oropharyngeal exudate.  Eyes: Pupils are equal, round, and reactive to light. Conjunctivae are normal. Right eye exhibits no discharge. Left eye exhibits no discharge. No scleral icterus.  Neck: Normal range of motion. Neck supple. No thyromegaly present.  Cardiovascular: Normal rate, regular rhythm, normal heart sounds and intact distal pulses.  No  murmur heard. Pulses:      Radial pulses are 2+ on the right side, and 2+ on the left side.       Dorsalis pedis pulses are 2+ on the right side, and 2+ on the left side.  Pulmonary/Chest: Effort normal and breath sounds normal. No respiratory distress. She has  no wheezes. She has no rhonchi. She has no rales.  Abdominal: Soft. Normal appearance and bowel sounds are normal. She exhibits no distension. There is no hepatosplenomegaly. There is generalized tenderness. There is guarding. There is no rigidity.  Musculoskeletal: Normal range of motion. She exhibits no edema, tenderness or deformity.  Lymphadenopathy:    She has no cervical adenopathy.  Neurological: She is alert and oriented to person, place, and time.  Skin: Skin is warm and dry. No rash noted. She is not diaphoretic.  Psychiatric: She has a normal mood and affect.  Nursing note and vitals reviewed.   Results for orders placed or performed during the hospital encounter of 08/08/18 (from the past 48 hour(s))  Lipase, blood     Status: None   Collection Time: 08/08/18 11:11 PM  Result Value Ref Range   Lipase 35 11 - 51 U/L    Comment: Performed at [facility], [address] [place], [place] [address]  Comprehensive metabolic panel     Status: Abnormal   Collection Time: 08/08/18 11:11 PM  Result Value Ref Range   Sodium 137 135 - 145 mmol/L   Potassium 4.4 3.5 - 5.1 mmol/L   Chloride 99 98 - 111 mmol/L   CO2 28 22 - 32 mmol/L   Glucose, Bld 124 (H) 70 - 99 mg/dL   BUN 14 6 - 20 mg/dL   Creatinine, Ser 1.14 (H) 0.44 - 1.00 mg/dL   Calcium 9.5 8.9 - 10.3 mg/dL   Total Protein 7.7 6.5 - 8.1 g/dL   Albumin 4.3 3.5 - 5.0 g/dL   AST 27 15 - 41 U/L   ALT 18 0 - 44 U/L   Alkaline Phosphatase 75 38 - 126 U/L   Total Bilirubin 0.9 0.3 - 1.2 mg/dL   GFR calc non Af Amer 54 (L) >60 mL/min   GFR calc Af Amer >60 >60 mL/min    Comment: (NOTE) The eGFR has been calculated using the CKD EPI equation. This calculation has not been validated in all clinical situations. eGFR's persistently <60 mL/min signify possible Chronic Kidney Disease.    Anion gap 10 5 - 15    Comment: Performed at [facility] [address] [place] [address]  CBC     Status: None    Collection Time: 08/08/18 11:11 PM  Result Value Ref Range   WBC 7.1 4.0 - 10.5 K/uL   RBC 4.32 3.87 - 5.11 MIL/uL   Hemoglobin 13.6 12.0 - 15.0 g/dL   HCT 41.0 36.0 - 46.0 %   MCV 94.9 78.0 - 100.0 fL   MCH 31.5 26.0 - 34.0 pg   MCHC 33.2 30.0 - 36.0 g/dL   RDW 12.0 11.5 - 15.5 %   Platelets 186 150 - 400 K/uL    Comment: Performed at [facility] [address], [place], [place] [address]  I-Stat beta hCG blood, ED     Status: None   Collection Time: 08/08/18 11:16 PM  Result Value Ref Range   I-stat hCG, quantitative <5.0 <5 mIU/mL   Comment 3  Comment:   GEST. AGE      CONC.  (mIU/mL)   <=1 WEEK        5 - 50     2 WEEKS       50 - 500     3 WEEKS       100 - 10,000     4 WEEKS     1,000 - 30,000        FEMALE AND NON-PREGNANT FEMALE:     LESS THAN 5 mIU/mL    Ct Abdomen Pelvis W Contrast  Result Date: 08/09/2018 CLINICAL DATA:  Abdominal pain, distention, nausea, vomiting. EXAM: CT ABDOMEN AND PELVIS WITH CONTRAST TECHNIQUE: Multidetector CT imaging of the abdomen and pelvis was performed using the standard protocol following bolus administration of intravenous contrast. CONTRAST:  121m OMNIPAQUE IOHEXOL 300 MG/ML  SOLN COMPARISON:  11/01/2013 FINDINGS: Lower chest: Subpleural nodule in the left lower lobe measures 4 mm. This is better visualized than prior study but appears to be present compatible with a benign nodule. No effusions. Hepatobiliary: Small hypodensity anteriorly in the inferior right hepatic lobe compatible with small cysts, stable since 2014. Gallbladder unremarkable. Pancreas: No focal abnormality or ductal dilatation. Spleen: No focal abnormality.  Normal size. Adrenals/Urinary Tract: Left kidney is absent. No hydronephrosis on the right. Adrenal glands and urinary bladder unremarkable. Stomach/Bowel: Dilated mid abdominal small bowel loops with decompressed proximal and distal small bowel loops. Findings compatible with small bowel obstruction.  Exact cause and transition point not visualized. Vascular/Lymphatic: No evidence of aneurysm or adenopathy. Reproductive: Prior hysterectomy.  No adnexal masses. Other: Small amount of free fluid in the pelvis.  No free air. Musculoskeletal: No acute bony abnormality. IMPRESSION: Dilated fluid-filled mid small bowel loops in the abdomen and pelvis compatible with small bowel obstruction. Exact cause and transition point not visualized. Small amount of free fluid in the pelvis. Electronically Signed   By: KRolm BaptiseM.D.   On: 08/09/2018 07:11      Assessment/Plan Solitary kidney (donor) HTN - holding home meds  SBO - extensive surgical hx - SBO protocol with gastrografin - good BS, not distended and had small amt of flatus - ideally this will resolve without the need for surgery  FEN: NPO, IVF, IV pepcid VTE: SCD's, lovenox ID: none Foley: none Follow up: TBD  Plan: admit to [facility], IVF, NGT, SBO protocol  JKalman Drape [facility] 08/09/2018, 8:30 AM Pager: [phone]: [phone]-Fri 7:00 am-4:30 pm Sat-Sun 7:00 am-11:30 am

## 2018-08-09 NOTE — ED Notes (Signed)
NGT placed on LIS at this time.  Pt tolerating well.  No significant gastric fluids returned at this time.  ABD XR confirms placement.

## 2018-08-09 NOTE — ED Notes (Addendum)
Attempted to insert NG tube x2 unsuccessfully. Patient tolerated both attempts well. MD made aware.

## 2018-08-09 NOTE — ED Notes (Signed)
Patient aware of need for urine, unable to go at this time.

## 2018-08-09 NOTE — ED Notes (Signed)
Pt ambulated to bathroom steady gait.

## 2018-08-09 NOTE — ED Notes (Signed)
Attempted to call report at this time.  Phone number for receiving RN called and no answer.

## 2018-08-10 ENCOUNTER — Inpatient Hospital Stay (HOSPITAL_COMMUNITY): Payer: 59

## 2018-08-10 ENCOUNTER — Encounter (HOSPITAL_COMMUNITY): Payer: Self-pay

## 2018-08-10 LAB — BASIC METABOLIC PANEL
Anion gap: 8 (ref 5–15)
BUN: 9 mg/dL (ref 6–20)
CALCIUM: 8.6 mg/dL — AB (ref 8.9–10.3)
CO2: 25 mmol/L (ref 22–32)
Chloride: 107 mmol/L (ref 98–111)
Creatinine, Ser: 1.08 mg/dL — ABNORMAL HIGH (ref 0.44–1.00)
GFR calc Af Amer: >60 mL/min (ref >60)
GFR, EST NON AFRICAN AMERICAN: 57 mL/min — AB (ref >60)
GLUCOSE: 99 mg/dL (ref 70–99)
Potassium: 4.1 mmol/L (ref 3.5–5.1)
SODIUM: 140 mmol/L (ref 135–145)

## 2018-08-10 LAB — HIV ANTIBODY (ROUTINE TESTING W REFLEX): HIV SCREEN 4TH GENERATION: NONREACTIVE

## 2018-08-10 MED ORDER — MENTHOL 3 MG MT LOZG
1.0000 | LOZENGE | OROMUCOSAL | Status: DC | PRN
  Filled 2018-08-10: qty 9

## 2018-08-10 MED ORDER — DIATRIZOATE MEGLUMINE & SODIUM 66-10 % PO SOLN
90.0000 mL | Freq: Once | ORAL | Status: AC
  Administered 2018-08-10: 90 mL via NASOGASTRIC
  Filled 2018-08-10: qty 90

## 2018-08-10 NOTE — Progress Notes (Signed)
[facility] Progress Note      Assessment/Plan Solitary kidney (donor) HTN - holding home meds  SBO - extensive surgical hx - SBO protocol with gastrografin - good BS, not distended and had small amt of flatus - ideally this will resolve without the need for surgery  FEN: NPO, IVF, IV pepcid VTE: SCD's, lovenox ID: none Foley: none Follow up: TBD  Plan: scant NGT outpt. Repeat xray to confirm placement before administration of gastrografin, 8hr delay film.    LOS: 1 day    Subjective: CC: SBO  Abdominal pain this am but none currently. Having a small amount of flatus. No BM. Scant NGT output.   Objective: Vital signs in last 24 hours: Temp:  [97.6 F (36.4 C)-99.4 F (37.4 C)] 98.8 F (37.1 C) (08/14 0841) Pulse Rate:  [60-85] 60 (08/14 0841) Resp:  [12-16] 12 (08/13 2016) BP: (113-140)/(54-83) 128/67 (08/14 0841) SpO2:  [96 %-100 %] 100 % (08/14 0841) Weight:  [68.8 kg] 68.8 kg (08/13 2000)    Intake/Output from previous day: 08/13 0701 - 08/14 0700 In: 2036.5 [I.V.:1986.5; IV Piggyback:50] Out: -  Intake/Output this shift: No intake/output data recorded.  PE: Gen:  Alert, NAD, pleasant, cooperative Card:  RRR, no M/G/R heard Pulm:  CTA, no W/R/R, effort normal Abd: Soft, NT/ND,  Hypoactive BS, no HSM Skin: no rashes noted, warm and dry   Anti-infectives: Anti-infectives (From admission, onward)   None      Lab Results:  Recent Labs    08/08/18 2311  WBC 7.1  HGB 13.6  HCT 41.0  PLT 186   BMET Recent Labs    08/08/18 2311 08/10/18 0342  NA 137 140  K 4.4 4.1  CL 99 107  CO2 28 25  GLUCOSE 124* 99  BUN 14 9  CREATININE 1.14* 1.08*  CALCIUM 9.5 8.6*   PT/INR No results for input(s): LABPROT, INR in the last 72 hours. CMP     Component Value Date/Time   NA 140 08/10/2018 0342   K 4.1 08/10/2018 0342   CL 107 08/10/2018 0342   CO2 25 08/10/2018 0342   GLUCOSE 99 08/10/2018 0342   BUN 9 08/10/2018  0342   CREATININE 1.08 (H) 08/10/2018 0342   CALCIUM 8.6 (L) 08/10/2018 0342   PROT 7.7 08/08/2018 2311   ALBUMIN 4.3 08/08/2018 2311   AST 27 08/08/2018 2311   ALT 18 08/08/2018 2311   ALKPHOS 75 08/08/2018 2311   BILITOT 0.9 08/08/2018 2311   GFRNONAA 57 (L) 08/10/2018 0342   GFRAA >60 08/10/2018 0342   Lipase     Component Value Date/Time   LIPASE 35 08/08/2018 2311    Studies/Results: Dg Abd 1 View  Result Date: 08/09/2018 CLINICAL DATA:  Nasogastric tube placement. EXAM: ABDOMEN - 1 VIEW COMPARISON:  CT abdomen 08/09/2018 FINDINGS: A single spot image demonstrates a nasogastric tube extending into the stomach body and coiling up to terminate near the stomach cardia. IMPRESSION: 1. Nasogastric tube extends into the stomach, coils once to terminate near the stomach cardia. Electronically Signed   By: Gaylyn RongWalter  Liebkemann M.D.   On: 08/09/2018 14:47   Ct Abdomen Pelvis W Contrast  Result Date: 08/09/2018 CLINICAL DATA:  Abdominal pain, distention, nausea, vomiting. EXAM: CT ABDOMEN AND PELVIS WITH CONTRAST TECHNIQUE: Multidetector CT imaging of the abdomen and pelvis was performed using the standard protocol following bolus administration of intravenous contrast. CONTRAST:  100mL OMNIPAQUE IOHEXOL 300 MG/ML  SOLN COMPARISON:  11/01/2013 FINDINGS: Lower chest: Subpleural  nodule in the left lower lobe measures 4 mm. This is better visualized than prior study but appears to be present compatible with a benign nodule. No effusions. Hepatobiliary: Small hypodensity anteriorly in the inferior right hepatic lobe compatible with small cysts, stable since 2014. Gallbladder unremarkable. Pancreas: No focal abnormality or ductal dilatation. Spleen: No focal abnormality.  Normal size. Adrenals/Urinary Tract: Left kidney is absent. No hydronephrosis on the right. Adrenal glands and urinary bladder unremarkable. Stomach/Bowel: Dilated mid abdominal small bowel loops with decompressed proximal and distal  small bowel loops. Findings compatible with small bowel obstruction. Exact cause and transition point not visualized. Vascular/Lymphatic: No evidence of aneurysm or adenopathy. Reproductive: Prior hysterectomy.  No adnexal masses. Other: Small amount of free fluid in the pelvis.  No free air. Musculoskeletal: No acute bony abnormality. IMPRESSION: Dilated fluid-filled mid small bowel loops in the abdomen and pelvis compatible with small bowel obstruction. Exact cause and transition point not visualized. Small amount of free fluid in the pelvis. Electronically Signed   By: Charlett NoseKevin  Dover M.D.   On: 08/09/2018 07:11      Jerre SimonJessica L Jasiah Elsen , [facility] 08/10/2018, 10:07 AM  Pager: [phone] Mon-Wed, Friday 7:00am-4:30pm Thurs 7am-11:30am  Consults: [phone]

## 2018-08-10 NOTE — Progress Notes (Signed)
Repeat xray for placement of NGT, P.A states that NGT was coiled and will have to be pulled back.

## 2018-08-10 NOTE — Progress Notes (Signed)
NGT was pulled back by ~3 cm.  Patient states that it "feels better" Patient given 90ml of gastrografin via NGT Radiology called and made aware, said they will come and get patient around 2030 Nurse educated family on the length of time between ingestion and scan

## 2018-08-11 ENCOUNTER — Encounter (HOSPITAL_COMMUNITY): Payer: Self-pay | Admitting: *Deleted

## 2018-08-11 MED ORDER — FAMOTIDINE 20 MG PO TABS
20.0000 mg | ORAL_TABLET | Freq: Every day | ORAL | Status: DC
  Administered 2018-08-12: 20 mg via ORAL
  Filled 2018-08-11: qty 1

## 2018-08-11 MED ORDER — HYDROCHLOROTHIAZIDE 12.5 MG PO CAPS
12.5000 mg | ORAL_CAPSULE | ORAL | Status: DC
  Administered 2018-08-11: 12.5 mg via ORAL
  Filled 2018-08-11: qty 1

## 2018-08-11 MED ORDER — LOSARTAN POTASSIUM 50 MG PO TABS
25.0000 mg | ORAL_TABLET | Freq: Every day | ORAL | Status: DC
  Administered 2018-08-11 – 2018-08-12 (×2): 25 mg via ORAL
  Filled 2018-08-11 (×2): qty 1

## 2018-08-11 MED ORDER — ENSURE ENLIVE PO LIQD
237.0000 mL | Freq: Two times a day (BID) | ORAL | Status: DC
  Administered 2018-08-11: 237 mL via ORAL

## 2018-08-11 NOTE — Progress Notes (Signed)
PHARMACIST - PHYSICIAN COMMUNICATION  DR:   Tyson DenseWakefiels  CONCERNING: IV to Oral Route Change Policy  RECOMMENDATION: This patient is receiving Famotidine by the intravenous route.  Based on criteria approved by the Pharmacy and Therapeutics Committee, the intravenous medication(s) is/are being converted to the equivalent oral dose form(s).   DESCRIPTION: These criteria include:  The patient is eating (either orally or via tube) and/or has been taking other orally administered medications for a least 24 hours  The patient has no evidence of active gastrointestinal bleeding or impaired GI absorption (gastrectomy, short bowel, patient on TNA or NPO).  If you have questions about this conversion, please contact the Pharmacy Department  []   [phone]( [phone] )  [facility] []   [phone]( [phone] )  [facility] [x]   [phone]( [phone] )  [facility] []   [phone]( [phone] )  [facility] []   [phone]( [phone] )  [facility]   Tera MaterCatherine A Arnice Vanepps, [facility]kaRPH 08/11/2018 1:52 PM

## 2018-08-11 NOTE — Progress Notes (Signed)
   Subjective/Chief Complaint: Contrast in colon, a lot of loose stool and gas   Objective: Vital signs in last 24 hours: Temp:  [98.4 F (36.9 C)-99.5 F (37.5 C)] 98.4 F (36.9 C) (08/15 0902) Pulse Rate:  [68-80] 72 (08/15 0902) BP: (135-149)/(64-80) 135/66 (08/15 0902) SpO2:  [98 %-100 %] 99 % (08/15 0902) Last BM Date: 08/10/18  Intake/Output from previous day: 08/14 0701 - 08/15 0700 In: 1454.9 [I.V.:1404.9; IV Piggyback:50] Out: 10 [Emesis/NG output:10] Intake/Output this shift: No intake/output data recorded.  GI: soft nontender bs present  Lab Results:  Recent Labs    08/08/18 2311  WBC 7.1  HGB 13.6  HCT 41.0  PLT 186   BMET Recent Labs    08/08/18 2311 08/10/18 0342  NA 137 140  K 4.4 4.1  CL 99 107  CO2 28 25  GLUCOSE 124* 99  BUN 14 9  CREATININE 1.14* 1.08*  CALCIUM 9.5 8.6*   PT/INR No results for input(s): LABPROT, INR in the last 72 hours. ABG No results for input(s): PHART, HCO3 in the last 72 hours.  Invalid input(s): PCO2, PO2  Studies/Results: Dg Abd 1 View  Result Date: 08/09/2018 CLINICAL DATA:  Nasogastric tube placement. EXAM: ABDOMEN - 1 VIEW COMPARISON:  CT abdomen 08/09/2018 FINDINGS: A single spot image demonstrates a nasogastric tube extending into the stomach body and coiling up to terminate near the stomach cardia. IMPRESSION: 1. Nasogastric tube extends into the stomach, coils once to terminate near the stomach cardia. Electronically Signed   By: Gaylyn RongWalter  Liebkemann M.D.   On: 08/09/2018 14:47   Dg Abd Portable 1v-small Bowel Obstruction Protocol-initial, 8 Hr Delay  Result Date: 08/10/2018 CLINICAL DATA:  Small bowel obstruction. EXAM: PORTABLE ABDOMEN - 1 VIEW COMPARISON:  08/10/2018 FINDINGS: Enteric tube is present in the left upper quadrant, coiled over the expected location of the upper stomach. No significant change in position. Contrast material is demonstrated throughout the colon suggesting no evidence for  high-grade obstruction. No evidence of small or large bowel distention. Surgical clip in the left upper quadrant. Visualized bones and soft tissue contours appear intact. IMPRESSION: Contrast material is demonstrated throughout the colon suggesting no evidence of high-grade obstruction. Electronically Signed   By: Burman NievesWilliam  Stevens M.D.   On: 08/10/2018 21:09   Dg Abd Portable 1v-small Bowel Protocol-position Verification  Result Date: 08/10/2018 CLINICAL DATA:  Nasogastric tube placement. EXAM: PORTABLE ABDOMEN - 1 VIEW COMPARISON:  08/09/2018. FINDINGS: Nasogastric tube is coiled in the stomach, with the tip near the gastroesophageal junction. Fair amount of stool is seen in the ascending colon. Bowel gas pattern is otherwise unremarkable. IMPRESSION: Nasogastric tube is coiled in the stomach. Electronically Signed   By: Leanna BattlesMelinda  Blietz M.D.   On: 08/10/2018 10:37    Anti-infectives: Anti-infectives (From admission, onward)   None      Assessment/Plan: SBO -clinically and radiologically resolved - dc ng, fulls, ensure -discussed small risk recurrence -possibly home later today  Jane Buckley 08/11/2018

## 2018-08-12 NOTE — Discharge Summary (Signed)
     Patient ID: Jane Buckley [id number] 02-Dec-1961 57 y.o.  Admit date: 08/08/2018 Discharge date: 08/12/2018  Admitting Diagnosis: SBO  Discharge Diagnosis Patient Active Problem List   Diagnosis Date Noted  . Small bowel obstruction (HCC) 08/09/2018  . S/P laparotomy for adhesiolysis for SBO 08/26/2013  . Abdominal pain 08/21/2013  . Anemia 08/21/2013  . S/P hysterectomy with oophorectomy 02/07/2013  . Physical examination of potential organ donor 02/07/2013    Consultants none  Reason for Admission: Pt is a 57 yo female with a hx of appendectomy, hysterectomy, kidney donor, Ex lap with LOA in 2014 for SBO who presented to the ED with complaints of abdominal pain, nausea, and vomiting that started yesterday. Pt states she began having generalized intermittent, crampy abdominal pain at work that progressively worsened to constant and severe. Associated nausea, vomiting and mild bloating. She tried pepsi without relief. Emesis was watery and brown. Small BM last night and small amount of flatus since arrival to the ED. She states this feels similar to her SBO in 2014 only less bloating. No fever, chills, CP, SOB, urinary symptoms, no melena. Last normal BM was 3 days ago. Pt does not take anticoagulation and only takes medicine for HTN.   CT scan showed Dilated fluid-filled mid small bowel loops in the abdomen and pelvis compatible with small bowel obstruction. Exact cause and transition point not visualized.  Procedures none  Hospital Course:  The patient was admitted and had an NGT placed and SBO protocol was initiated.  On her delayed 8 hr films she had contrast in her colon.  Her NGT was removed and her diet was advanced to full liquids.  She was tolerating liquids at the time of discharge and encouraged to advance her diet as tolerates.  She was otherwise doing well and stable for DC home.  Physical Exam: See note from earlier today  Allergies as of 08/12/2018    Reactions   Bactrim [sulfamethoxazole-trimethoprim] Shortness Of Breath   Sulfa Antibiotics Shortness Of Breath      Medication List    TAKE these medications   diphenhydrAMINE 25 MG tablet Commonly known as:  BENADRYL Take 1 tablet (25 mg total) by mouth every 6 (six) hours.   estradiol 0.05 MG/24HR patch Commonly known as:  VIVELLE-DOT Place 1 patch onto the skin 2 (two) times a week. On Sunday and Wednesday   famotidine 20 MG tablet Commonly known as:  PEPCID Take 1 tablet (20 mg total) by mouth 2 (two) times daily.   hydrochlorothiazide 12.5 MG capsule Commonly known as:  MICROZIDE Take 12.5 mg by mouth every other day.   losartan 25 MG tablet Commonly known as:  COZAAR Take 25 mg by mouth daily.   predniSONE 10 MG tablet Commonly known as:  DELTASONE Take 5 tab day 1, take 4 tab day 2, take 3 tab day 3, take 2 tab day 4, and take 1 tab day 5        Follow-up Information    Emelia Buckley, Matthew, MD Follow up in 4 week(s).   Specialty:  General Surgery Why:  our office will call you with an appointment date and time Contact information: [address] [place] [address] [phone]           Signed: Barnetta ChapelKelly Mychal Buckley, [facility] 08/12/2018, 10:31 AM Pager: [phone]

## 2018-08-12 NOTE — Discharge Instructions (Signed)
Full Liquid Diet, advance diet as tolerates A full liquid diet may be used:  To help you transition from a clear liquid diet to a soft diet.  When your body is healing and can only tolerate foods that are easy to digest.  Before or after certain a procedure, test, or surgery (such as stomach or intestinal surgeries).  If you have trouble swallowing or chewing.  A full liquid diet includes fluids and foods that are liquid or will become liquid at room temperature. The full liquid diet gives you the proteins, fluids, salts, and minerals that you need for energy. If you continue this diet for more than 72 hours, talk to your health care provider about how many calories you need to consume. If you continue the diet for more than 5 days, talk to your health care provider about taking a multivitamin or a nutritional supplement. What do I need to know about a full liquid diet?  You may have any liquid.  You may have any food that becomes a liquid at room temperature. The food is considered a liquid if it can be poured off a spoon at room temperature.  Drink one serving of citrus or vitamin C-enriched fruit juice daily. What foods can I eat? Grains Any grain food that can be pureed in soup (such as crackers, pasta, and rice). Hot cereal (such as farina or oatmeal) that has been blended. Talk to your health care provider or dietitian about these foods. Vegetables Pulp-free tomato or vegetable juice. Vegetables pureed in soup. Fruits Fruit juice, including nectars and juices with pulp. Meats and Other Protein Sources Eggs in custard, eggnog mix, and eggs used in ice cream or pudding. Strained meats, like in baby food, may be allowed. Consult your health care provider. Dairy Milk and milk-based beverages, including milk shakes and instant breakfast mixes. Smooth yogurt. Pureed cottage cheese. Avoid these foods if they are not well tolerated. Beverages All beverages, including liquid nutritional  supplements. Ask your health care provider if you can have carbonated beverages. They may not be well tolerated. Condiments Iodized salt, pepper, spices, and flavorings. Cocoa powder. Vinegar, ketchup, yellow mustard, smooth sauces (such as hollandaise, cheese sauce, or white sauce), and soy sauce. Sweets and Desserts Custard, smooth pudding. Flavored gelatin. Tapioca, junket. Plain ice cream, sherbet, fruit ices. Frozen ice pops, frozen fudge pops, pudding pops, and other frozen bars with cream. Syrups, including chocolate syrup. Sugar, honey, jelly. Fats and Oils Margarine, butter, cream, sour cream, and oils. Other Broth and cream soups. Strained, broth-based soups. The items listed above may not be a complete list of recommended foods or beverages. Contact your dietitian for more options. What foods can I not eat? Grains All breads. Grains are not allowed unless they are pureed into soup. Vegetables Vegetables are not allowed unless they are juiced, or cooked and pureed into soup. Fruits Fruits are not allowed unless they are juiced. Meats and Other Protein Sources Any meat or fish. Cooked or raw eggs. Nut butters. Dairy Cheese. Condiments Stone ground mustards. Fats and Oils Fats that are coarse or chunky. Sweets and Desserts Ice cream or other frozen desserts that have any solids in them or on top, such as nuts, chocolate chips, and pieces of cookies. Cakes. Cookies. Candy. Others Soups with chunks or pieces in them. The items listed above may not be a complete list of foods and beverages to avoid. Contact your dietitian for more information. This information is not intended to replace advice given  to you by your health care provider. Make sure you discuss any questions you have with your health care provider. Document Released: 12/14/2005 Document Revised: 05/21/2016 Document Reviewed: 10/19/2013 Elsevier Interactive Patient Education  2017 [employer]

## 2018-08-12 NOTE — Plan of Care (Signed)
  Problem: Health Behavior/Discharge Planning: Goal: Ability to manage health-related needs will improve Outcome: Progressing   Problem: Clinical Measurements: Goal: Will remain free from infection Outcome: Progressing Goal: Respiratory complications will improve Outcome: Progressing Goal: Cardiovascular complication will be avoided Outcome: Progressing   

## 2018-08-12 NOTE — Progress Notes (Signed)
Patient ID: Jane LinesValerie D Blaize, female   DOB: 1961-09-03, 57 y.o.   MRN: [id number]       Subjective: Pt has only taken in a little clear liquid.  She states that occasionally after this she has had some nausea.  She has yet to try anything of more sustenance.  She is still passing gas and stool.  No abdominal pain.    Objective: Vital signs in last 24 hours: Temp:  [97.9 F (36.6 C)-98.6 F (37 C)] 98.6 F (37 C) (08/16 0834) Pulse Rate:  [64-88] 69 (08/16 0834) BP: (118-144)/(50-76) 131/73 (08/16 0834) SpO2:  [93 %-100 %] 98 % (08/16 0834) Last BM Date: 08/11/18  Intake/Output from previous day: 08/15 0701 - 08/16 0700 In: 1711.6 [P.O.:120; I.V.:1541.6; IV Piggyback:50] Out: 320 [Emesis/NG output:320] Intake/Output this shift: No intake/output data recorded.  PE: Heart: regular Lungs: CTAB Abd: soft, NT, ND, +BS  Lab Results:  No results for input(s): WBC, HGB, HCT, PLT in the last 72 hours. BMET Recent Labs    08/10/18 0342  NA 140  K 4.1  CL 107  CO2 25  GLUCOSE 99  BUN 9  CREATININE 1.08*  CALCIUM 8.6*   PT/INR No results for input(s): LABPROT, INR in the last 72 hours. CMP     Component Value Date/Time   NA 140 08/10/2018 0342   K 4.1 08/10/2018 0342   CL 107 08/10/2018 0342   CO2 25 08/10/2018 0342   GLUCOSE 99 08/10/2018 0342   BUN 9 08/10/2018 0342   CREATININE 1.08 (H) 08/10/2018 0342   CALCIUM 8.6 (L) 08/10/2018 0342   PROT 7.7 08/08/2018 2311   ALBUMIN 4.3 08/08/2018 2311   AST 27 08/08/2018 2311   ALT 18 08/08/2018 2311   ALKPHOS 75 08/08/2018 2311   BILITOT 0.9 08/08/2018 2311   GFRNONAA 57 (L) 08/10/2018 0342   GFRAA >60 08/10/2018 0342   Lipase     Component Value Date/Time   LIPASE 35 08/08/2018 2311       Studies/Results: Dg Abd Portable 1v-small Bowel Obstruction Protocol-initial, 8 Hr Delay  Result Date: 08/10/2018 CLINICAL DATA:  Small bowel obstruction. EXAM: PORTABLE ABDOMEN - 1 VIEW COMPARISON:  08/10/2018 FINDINGS:  Enteric tube is present in the left upper quadrant, coiled over the expected location of the upper stomach. No significant change in position. Contrast material is demonstrated throughout the colon suggesting no evidence for high-grade obstruction. No evidence of small or large bowel distention. Surgical clip in the left upper quadrant. Visualized bones and soft tissue contours appear intact. IMPRESSION: Contrast material is demonstrated throughout the colon suggesting no evidence of high-grade obstruction. Electronically Signed   By: Burman NievesWilliam  Stevens M.D.   On: 08/10/2018 21:09   Dg Abd Portable 1v-small Bowel Protocol-position Verification  Result Date: 08/10/2018 CLINICAL DATA:  Nasogastric tube placement. EXAM: PORTABLE ABDOMEN - 1 VIEW COMPARISON:  08/09/2018. FINDINGS: Nasogastric tube is coiled in the stomach, with the tip near the gastroesophageal junction. Fair amount of stool is seen in the ascending colon. Bowel gas pattern is otherwise unremarkable. IMPRESSION: Nasogastric tube is coiled in the stomach. Electronically Signed   By: Leanna BattlesMelinda  Blietz M.D.   On: 08/10/2018 10:37    Anti-infectives: Anti-infectives (From admission, onward)   None       Assessment/Plan Solitary kidney (donor) HTN - holding home meds  SBO - extensive surgical hx - patient seems to be improving; however she still seems nervous to eat.  She is taking in some liquids, but has  not tried anything more thick such as fulls.  Would like to make sure this isn't going to make her nauseated since she has had some intermittent nausea still prior to discharge home.  If she is able to take in a little fulls then we will recheck her this afternoon for DC home to advance her diet as she tolerates at home.  FEN: fulls VTE: SCD's, lovenox ID: none Foley: none Follow up: PCP   LOS: 3 days    Letha CapeKelly E Dimitra [place] , [facility] 08/12/2018, 9:49 AM Pager: [phone]

## 2018-10-07 ENCOUNTER — Other Ambulatory Visit: Payer: Self-pay | Admitting: Obstetrics and Gynecology

## 2018-10-07 DIAGNOSIS — Z1231 Encounter for screening mammogram for malignant neoplasm of breast: Secondary | ICD-10-CM

## 2018-10-25 ENCOUNTER — Ambulatory Visit
Admission: RE | Admit: 2018-10-25 | Discharge: 2018-10-25 | Disposition: A | Discharge status: Home / Self Care | Payer: 59 | Source: Ambulatory Visit | Attending: Obstetrics and Gynecology | Admitting: Obstetrics and Gynecology

## 2018-10-25 DIAGNOSIS — Z1231 Encounter for screening mammogram for malignant neoplasm of breast: Secondary | ICD-10-CM

## 2019-09-25 ENCOUNTER — Other Ambulatory Visit: Payer: Self-pay | Admitting: Internal Medicine

## 2019-09-25 DIAGNOSIS — Z1231 Encounter for screening mammogram for malignant neoplasm of breast: Secondary | ICD-10-CM

## 2019-11-06 ENCOUNTER — Ambulatory Visit
Admission: RE | Admit: 2019-11-06 | Discharge: 2019-11-06 | Disposition: A | Discharge status: Home / Self Care | Payer: 59 | Source: Ambulatory Visit | Attending: Internal Medicine | Admitting: Internal Medicine

## 2019-11-06 ENCOUNTER — Other Ambulatory Visit: Payer: Self-pay

## 2019-11-06 DIAGNOSIS — Z1231 Encounter for screening mammogram for malignant neoplasm of breast: Secondary | ICD-10-CM

## 2020-10-16 ENCOUNTER — Other Ambulatory Visit: Payer: Self-pay | Admitting: Internal Medicine

## 2020-10-16 DIAGNOSIS — Z1231 Encounter for screening mammogram for malignant neoplasm of breast: Secondary | ICD-10-CM

## 2020-12-05 ENCOUNTER — Other Ambulatory Visit: Payer: Self-pay

## 2020-12-05 ENCOUNTER — Ambulatory Visit
Admission: RE | Admit: 2020-12-05 | Discharge: 2020-12-05 | Disposition: A | Discharge status: Home / Self Care | Payer: 59 | Source: Ambulatory Visit | Attending: Internal Medicine | Admitting: Internal Medicine

## 2020-12-05 DIAGNOSIS — Z1231 Encounter for screening mammogram for malignant neoplasm of breast: Secondary | ICD-10-CM

## 2020-12-09 ENCOUNTER — Other Ambulatory Visit: Payer: Self-pay | Admitting: Internal Medicine

## 2020-12-09 DIAGNOSIS — R928 Other abnormal and inconclusive findings on diagnostic imaging of breast: Secondary | ICD-10-CM

## 2020-12-11 ENCOUNTER — Other Ambulatory Visit: Payer: Self-pay | Admitting: Obstetrics and Gynecology

## 2020-12-12 ENCOUNTER — Other Ambulatory Visit: Payer: Self-pay | Admitting: Obstetrics and Gynecology

## 2020-12-12 DIAGNOSIS — R928 Other abnormal and inconclusive findings on diagnostic imaging of breast: Secondary | ICD-10-CM

## 2020-12-16 ENCOUNTER — Other Ambulatory Visit: Payer: Self-pay | Admitting: Obstetrics and Gynecology

## 2020-12-16 ENCOUNTER — Other Ambulatory Visit: Payer: Self-pay

## 2020-12-16 ENCOUNTER — Ambulatory Visit
Admission: RE | Admit: 2020-12-16 | Discharge: 2020-12-16 | Disposition: A | Discharge status: Home / Self Care | Payer: 59 | Source: Ambulatory Visit | Attending: Internal Medicine | Admitting: Internal Medicine

## 2020-12-16 DIAGNOSIS — R928 Other abnormal and inconclusive findings on diagnostic imaging of breast: Secondary | ICD-10-CM

## 2021-06-23 ENCOUNTER — Other Ambulatory Visit: Payer: Self-pay

## 2021-06-23 ENCOUNTER — Ambulatory Visit
Admission: RE | Admit: 2021-06-23 | Discharge: 2021-06-23 | Disposition: A | Discharge status: Home / Self Care | Payer: 59 | Source: Ambulatory Visit | Attending: Obstetrics and Gynecology | Admitting: Obstetrics and Gynecology

## 2021-06-23 ENCOUNTER — Other Ambulatory Visit: Payer: Self-pay | Admitting: Obstetrics and Gynecology

## 2021-06-23 DIAGNOSIS — R928 Other abnormal and inconclusive findings on diagnostic imaging of breast: Secondary | ICD-10-CM

## 2021-06-23 DIAGNOSIS — N6489 Other specified disorders of breast: Secondary | ICD-10-CM

## 2021-12-24 ENCOUNTER — Ambulatory Visit
Admission: RE | Admit: 2021-12-24 | Discharge: 2021-12-24 | Disposition: A | Discharge status: Home / Self Care | Payer: 59 | Source: Ambulatory Visit | Attending: Obstetrics and Gynecology | Admitting: Obstetrics and Gynecology

## 2021-12-24 ENCOUNTER — Other Ambulatory Visit: Payer: Self-pay | Admitting: Obstetrics and Gynecology

## 2021-12-24 DIAGNOSIS — N6489 Other specified disorders of breast: Secondary | ICD-10-CM

## 2022-07-25 IMAGING — MG DIGITAL DIAGNOSTIC BILAT W/ TOMO W/ CAD
8 of 17 series · 8 of 40 positions shown · non-contrast
Comparison: Previous exam(s).

CLINICAL DATA: First six-month follow-up for probably benign
asymmetry in the RIGHT breast/axilla.

EXAM:
DIGITAL DIAGNOSTIC BILATERAL MAMMOGRAM WITH TOMOSYNTHESIS AND CAD;
ULTRASOUND RIGHT BREAST LIMITED
TECHNIQUE: Bilateral digital diagnostic mammography and breast tomosynthesis
was performed. The images were evaluated with computer-aided
detection.; Targeted ultrasound examination of the right breast was
performed

[R MLO synth-2D (1 of 5)]
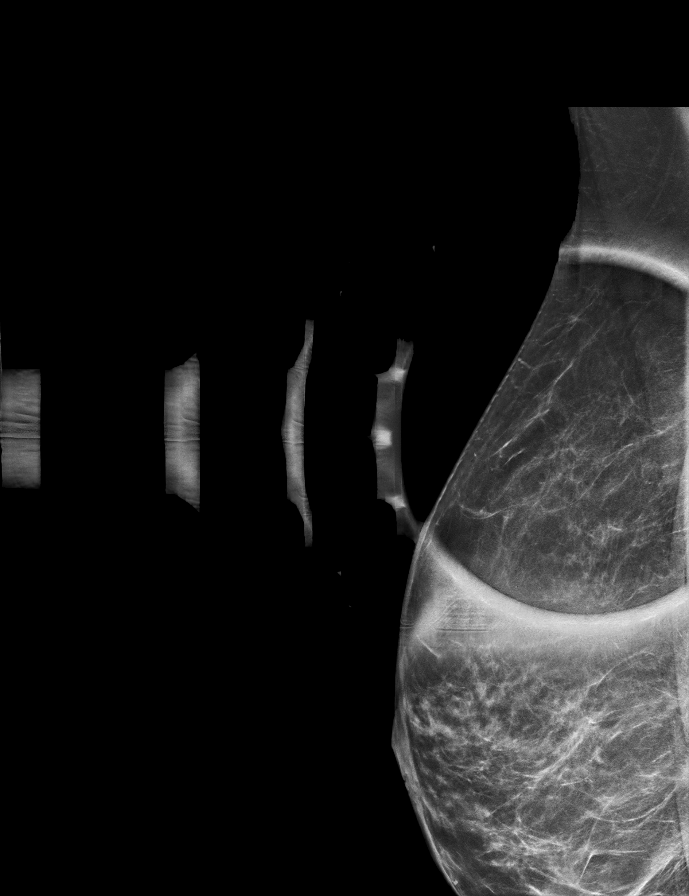

[R XCCL synth-2D]
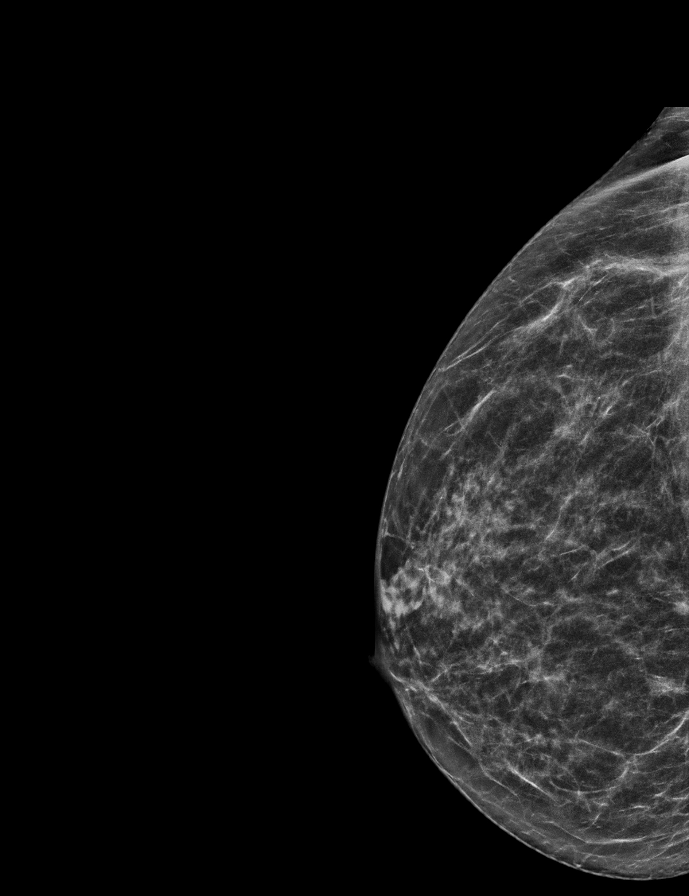

[R MLO synth-2D (2 of 5)]
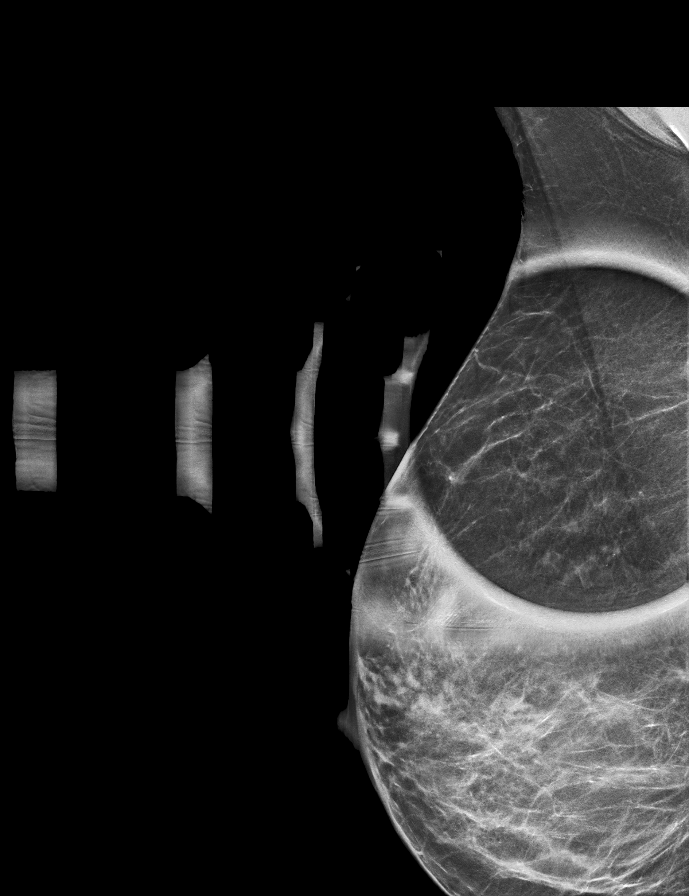

[R MLO synth-2D (3 of 5)]
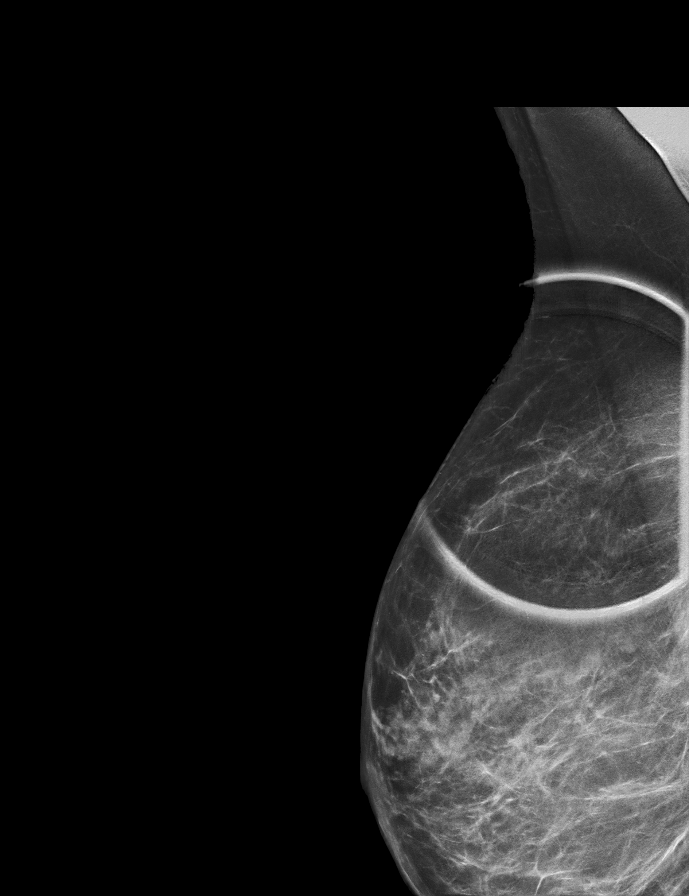

[R MLO synth-2D (4 of 5)]
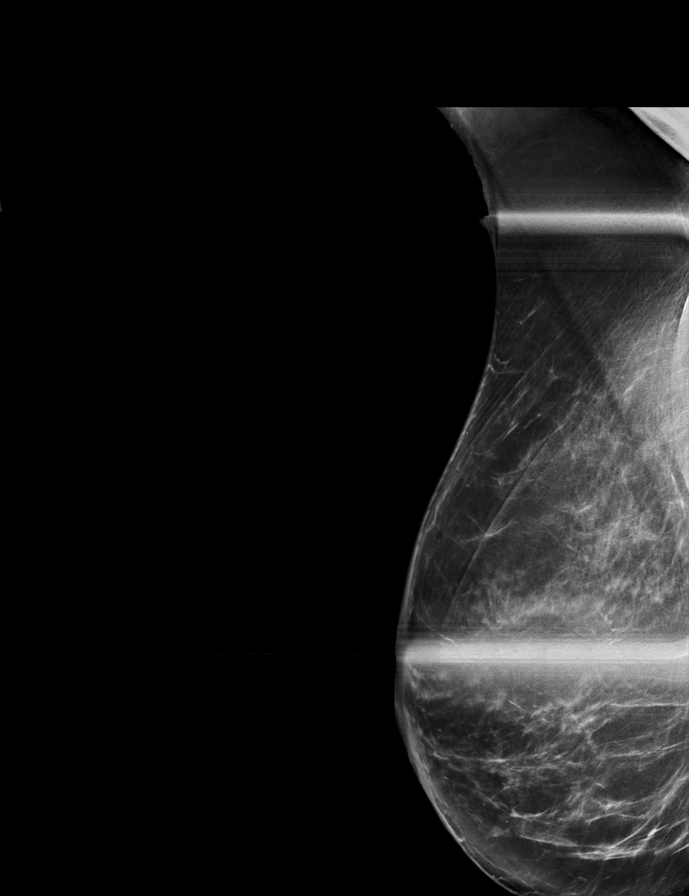

[L CC synth-2D]
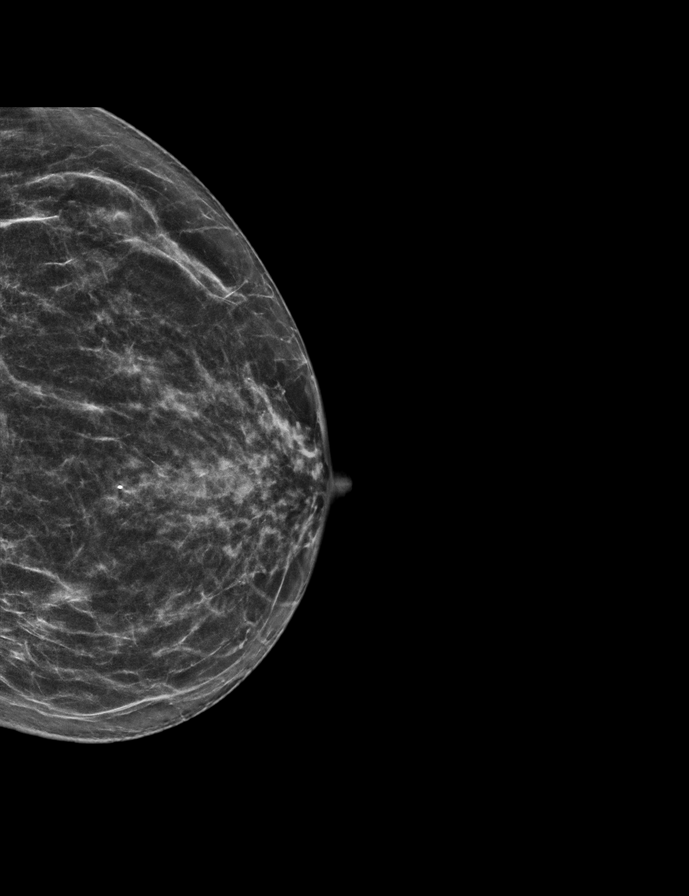

[R MLO synth-2D (5 of 5)]
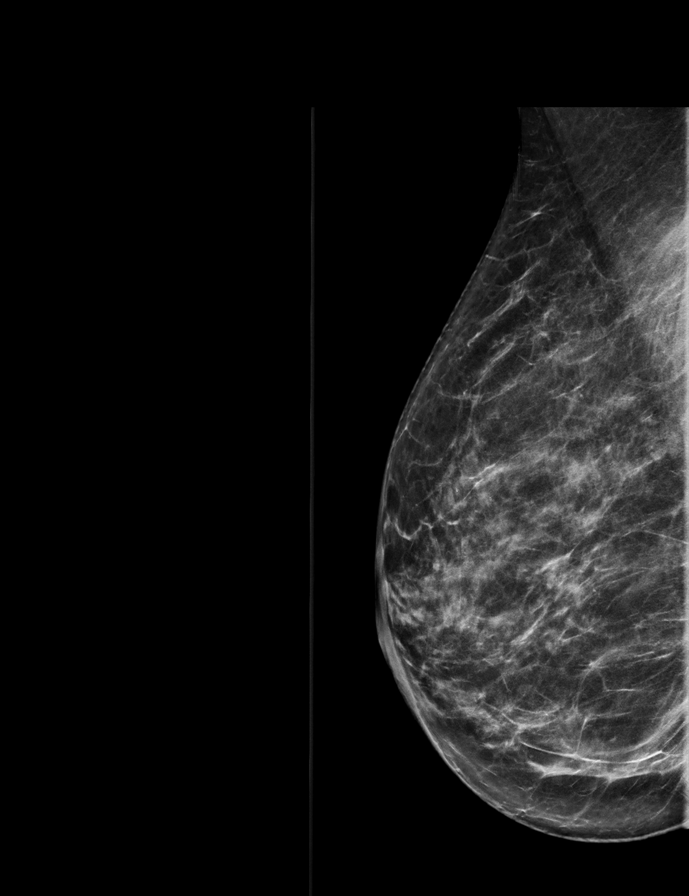

[R MLO tomo · tomo slice 25/48.0]
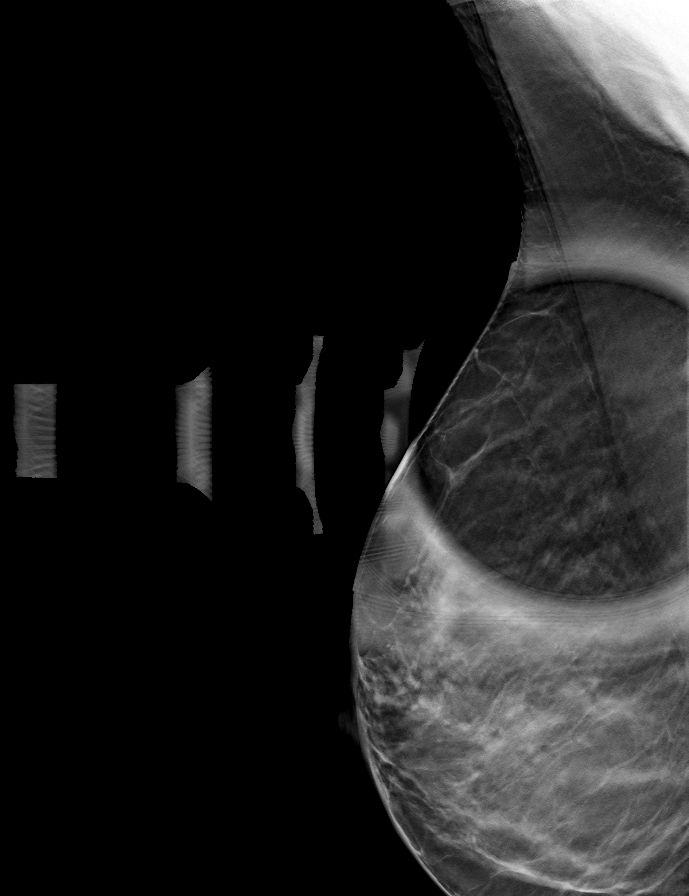

[8 of 40 positions shown; findings below may reference images not displayed]

ACR Breast Density Category b: There are scattered areas of
fibroglandular density.
FINDINGS: LEFT breast is negative.

Asymmetry in the UPPER-OUTER QUADRANT of the RIGHT breast appears
stable compared with previous studies. No new or suspicious findings
in the RIGHT breast.

Targeted ultrasound is performed, showing normal appearance of the
breast tissue in the UPPER-OUTER QUADRANT of the RIGHT breast. In
the RIGHT axillary tail, an island of dense fibroglandular tissue is
again image, similar in appearance to prior studies. This likely
represents the mammographic finding. No suspicious mass, distortion,
or acoustic shadowing is demonstrated with ultrasound.
IMPRESSION: Stable appearance of asymmetry in the RIGHT breast, favored to
represent benign breast parenchyma. No mammographic or ultrasound
evidence for malignancy.

RECOMMENDATION:
Recommend bilateral diagnostic mammogram and RIGHT breast ultrasound
in 1 year to complete follow-up.

I have discussed the findings and recommendations with the patient.
If applicable, a reminder letter will be sent to the patient
regarding the next appointment.

BI-RADS CATEGORY  3: Probably benign.

## 2022-09-06 ENCOUNTER — Encounter (HOSPITAL_COMMUNITY): Payer: Self-pay | Admitting: *Deleted

## 2022-09-06 ENCOUNTER — Ambulatory Visit (HOSPITAL_COMMUNITY)
Admission: EM | Admit: 2022-09-06 | Discharge: 2022-09-06 | Disposition: A | Discharge status: Home / Self Care | Payer: 59 | Attending: Physician Assistant | Admitting: Physician Assistant

## 2022-09-06 DIAGNOSIS — Z20822 Contact with and (suspected) exposure to covid-19: Secondary | ICD-10-CM | POA: Diagnosis not present

## 2022-09-06 DIAGNOSIS — R051 Acute cough: Secondary | ICD-10-CM | POA: Diagnosis present

## 2022-09-06 DIAGNOSIS — J069 Acute upper respiratory infection, unspecified: Secondary | ICD-10-CM | POA: Diagnosis not present

## 2022-09-06 LAB — RESP PANEL BY RT-PCR (FLU A&B, COVID) ARPGX2
Influenza A by PCR: NEGATIVE
Influenza B by PCR: NEGATIVE
SARS Coronavirus 2 by RT PCR: NEGATIVE

## 2022-09-06 MED ORDER — BENZONATATE 100 MG PO CAPS: 100.0000 mg | ORAL_CAPSULE | Freq: Three times a day (TID) | ORAL | 0 refills | Status: AC

## 2022-09-06 MED ORDER — PROMETHAZINE-DM 6.25-15 MG/5ML PO SYRP: 5.0000 mL | ORAL_SOLUTION | Freq: Four times a day (QID) | ORAL | 0 refills | Status: AC | PRN

## 2022-09-06 NOTE — ED Triage Notes (Signed)
C/O congestion, HA, deep cough onset 2 days ago.  No known fevers, but c/o "sweats". Reports feeling a "scab" inside nose this AM.

## 2022-09-06 NOTE — ED Provider Notes (Signed)
[facility]    CSN: [id number] Arrival date & time: 09/06/22  1359      History   Chief Complaint Chief Complaint  Patient presents with   Cough   Nasal Congestion    HPI Jane Buckley is a 61 y.o. female.   Pt complains of congestion, headache, cough that started three days ago.  No known fever, but reports chills and sweats.  She reports cough is worse at night.  Denies shortness of breath, n/v/d.  Denies sick contacts.     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      Home Medications    Prior to Admission medications   Medication Sig Start Date End Date Taking? Authorizing Provider  benzonatate (TESSALON) 100 MG capsule Take 1 capsule (100 mg total) by mouth every 8 (eight) hours. 09/06/22  Yes Ward, Tylene Fantasia, PA-C  BUPROPION HCL PO Take by mouth.   Yes [provider]  hydrochlorothiazide (MICROZIDE) 12.5 MG capsule Take 12.5 mg by mouth every other day. 11/01/17  Yes [provider]  losartan (COZAAR) 25 MG tablet Take 25 mg by mouth daily. 06/23/18  Yes [provider]  promethazine-dextromethorphan (PROMETHAZINE-DM) 6.25-15 MG/5ML syrup Take 5 mLs by mouth 4 (four) times daily as  needed for cough. 09/06/22  Yes Ward, Tylene Fantasia, PA-C  SERTRALINE HCL PO Take by mouth.   Yes [provider]  TERBINAFINE HCL PO Take by mouth.   Yes [provider]  diphenhydrAMINE (BENADRYL) 25 MG tablet Take 1 tablet (25 mg total) by mouth every 6 (six) hours. Patient not taking: Reported on 08/09/2018 02/17/15   Jaynie Crumble, PA-C  estradiol (VIVELLE-DOT) 0.05 MG/24HR patch Place 1 patch onto the skin 2 (two) times a week. On Sunday and Wednesday 08/03/18   [provider]  famotidine (PEPCID) 20 MG tablet Take 1 tablet (20 mg total) by mouth 2 (two) times daily. Patient not taking: Reported on 08/09/2018 02/17/15   Jaynie Crumble, PA-C  predniSONE (DELTASONE) 10 MG tablet Take 5 tab day 1, take 4 tab day 2, take 3 tab day 3, take 2 tab day 4, and take 1 tab day 5 Patient not taking: Reported on 08/09/2018 02/17/15   Jaynie Crumble, PA-C    Family History Family History  Problem Relation Age of Onset   Cancer Father    Heart disease Mother     Social History Social History   Tobacco Use   Smoking status: Former    Packs/day: 0.25    Years: 0.50    Total pack years: 0.13    Types: Cigarettes    Quit date: 12/28/1980    Years since quitting: 41.7  Smokeless tobacco: Never  Vaping Use   Vaping Use: Never used  Substance Use Topics   Alcohol use: Yes    Alcohol/week: 1.0 standard drink of alcohol    Types: 1 Glasses of wine per week   Drug use: No     Allergies   Bactrim [sulfamethoxazole-trimethoprim] and Sulfa antibiotics   Review of Systems Review of Systems  Constitutional:  Positive for chills. Negative for fever.  HENT:  Positive for congestion. Negative for ear pain and sore throat.   Eyes:  Negative for pain and visual disturbance.  Respiratory:  Positive for cough. Negative for shortness of breath.   Cardiovascular:  Negative for chest pain and palpitations.  Gastrointestinal:  Negative for abdominal pain and vomiting.   Genitourinary:  Negative for dysuria and hematuria.  Musculoskeletal:  Negative for arthralgias and back pain.  Skin:  Negative for color change and rash.  Neurological:  Negative for seizures and syncope.  All other systems reviewed and are negative.    Physical Exam Triage Vital Signs ED Triage Vitals  Enc Vitals Group     BP 09/06/22 1413 (!) 157/84     Pulse Rate 09/06/22 1413 86     Resp 09/06/22 1413 18     Temp 09/06/22 1413 98.6 F (37 C)     Temp Source 09/06/22 1413 Oral     SpO2 09/06/22 1413 98 %     Weight --      Height --      Head Circumference --      Peak Flow --      Pain Score 09/06/22 1414 0     Pain Loc --      Pain Edu? --      Excl. in GC? --    No data found.  Updated Vital Signs BP (!) 157/84   Pulse 86   Temp 98.6 F (37 C) (Oral)   Resp 18   LMP 08/20/1992   SpO2 98%   Visual Acuity Right Eye Distance:   Left Eye Distance:   Bilateral Distance:    Right Eye Near:   Left Eye Near:    Bilateral Near:     Physical Exam Vitals and nursing note reviewed.  Constitutional:      General: She is not in acute distress.    Appearance: She is well-developed.  HENT:     Head: Normocephalic and atraumatic.  Eyes:     Conjunctiva/sclera: Conjunctivae normal.  Cardiovascular:     Rate and Rhythm: Normal rate and regular rhythm.     Heart sounds: No murmur heard. Pulmonary:     Effort: Pulmonary effort is normal. No respiratory distress.     Breath sounds: Normal breath sounds.  Abdominal:     Palpations: Abdomen is soft.     Tenderness: There is no abdominal tenderness.  Musculoskeletal:        General: No swelling.     Cervical back: Neck supple.  Skin:    General: Skin is warm and dry.     Capillary Refill: Capillary refill takes less than 2 seconds.  Neurological:     Mental Status: She is alert.  Psychiatric:        Mood and Affect: Mood normal.      UC Treatments / Results  Labs (all labs ordered are listed, but only  abnormal results are displayed) Labs Reviewed  RESP PANEL BY RT-PCR (FLU A&B, COVID) [id number]    EKG   Radiology No results  found.  Procedures Procedures (including critical care time)  Medications Ordered in UC Medications - No data to display  Initial Impression / Assessment and Plan / UC Course  I have reviewed the triage vital signs and the nursing notes.  Pertinent labs & imaging results that were available during my care of the patient were reviewed by me and considered in my medical decision making (see chart for details).    URI, COVID/Flu swab pending.  Supportive care discussed.  Lungs clear, vitals wnl, stable for discharge.  Return precautions discussed.  Final Clinical Impressions(s) / UC Diagnoses   Final diagnoses:  Viral upper respiratory tract infection  Acute cough     Discharge Instructions      Take cough syrup as needed Drink plenty of fluids, rest COVID/Flu swab results pending Recommend Mucinex and Flonase Return for evaluation if symptoms become worse  If COVID positive recommendation self isolation for 5 days from first day of symptoms and then wear a mask around others for 5 days.    ED Prescriptions     Medication Sig Dispense Auth. Provider   promethazine-dextromethorphan (PROMETHAZINE-DM) 6.25-15 MG/5ML syrup Take 5 mLs by mouth 4 (four) times daily as needed for cough. 118 mL Ward, Shanda Bumps Z, PA-C   benzonatate (TESSALON) 100 MG capsule Take 1 capsule (100 mg total) by mouth every 8 (eight) hours. 21 capsule Ward, Tylene Fantasia, PA-C      PDMP not reviewed this encounter.   Ward, Tylene Fantasia, PA-C 09/06/22 1437

## 2022-09-06 NOTE — Discharge Instructions (Addendum)
Take cough syrup as needed Drink plenty of fluids, rest COVID/Flu swab results pending Recommend Mucinex and Flonase Return for evaluation if symptoms become worse  If COVID positive recommendation self isolation for 5 days from first day of symptoms and then wear a mask around others for 5 days.

## 2022-09-07 ENCOUNTER — Other Ambulatory Visit: Payer: Self-pay | Admitting: Obstetrics and Gynecology

## 2022-11-25 ENCOUNTER — Other Ambulatory Visit: Payer: Self-pay | Admitting: Internal Medicine

## 2022-11-25 DIAGNOSIS — Z1231 Encounter for screening mammogram for malignant neoplasm of breast: Secondary | ICD-10-CM

## 2022-12-10 ENCOUNTER — Other Ambulatory Visit: Payer: Self-pay | Admitting: Internal Medicine

## 2022-12-10 DIAGNOSIS — N6489 Other specified disorders of breast: Secondary | ICD-10-CM

## 2023-01-13 ENCOUNTER — Other Ambulatory Visit: Payer: 59

## 2023-01-23 ENCOUNTER — Ambulatory Visit
Admission: RE | Admit: 2023-01-23 | Discharge: 2023-01-23 | Disposition: A | Discharge status: Home / Self Care | Payer: 59 | Source: Ambulatory Visit | Attending: Internal Medicine | Admitting: Internal Medicine

## 2023-01-23 DIAGNOSIS — N6489 Other specified disorders of breast: Secondary | ICD-10-CM

## 2023-12-27 ENCOUNTER — Other Ambulatory Visit: Payer: Self-pay | Admitting: Obstetrics and Gynecology

## 2023-12-27 DIAGNOSIS — Z1231 Encounter for screening mammogram for malignant neoplasm of breast: Secondary | ICD-10-CM

## 2024-01-25 ENCOUNTER — Ambulatory Visit
Admission: RE | Admit: 2024-01-25 | Discharge: 2024-01-25 | Disposition: A | Discharge status: Home / Self Care | Payer: 59 | Source: Ambulatory Visit | Attending: Obstetrics and Gynecology | Admitting: Obstetrics and Gynecology

## 2024-01-25 DIAGNOSIS — Z1231 Encounter for screening mammogram for malignant neoplasm of breast: Secondary | ICD-10-CM

## 2024-10-13 ENCOUNTER — Other Ambulatory Visit: Payer: Self-pay | Admitting: Internal Medicine

## 2024-10-13 DIAGNOSIS — Z1231 Encounter for screening mammogram for malignant neoplasm of breast: Secondary | ICD-10-CM

## 2025-01-08 ENCOUNTER — Ambulatory Visit
Admission: RE | Admit: 2025-01-08 | Discharge: 2025-01-08 | Disposition: A | Source: Ambulatory Visit | Attending: Internal Medicine | Admitting: Internal Medicine

## 2025-01-08 DIAGNOSIS — Z1231 Encounter for screening mammogram for malignant neoplasm of breast: Secondary | ICD-10-CM

## 2025-01-25 ENCOUNTER — Other Ambulatory Visit: Payer: Self-pay | Admitting: Internal Medicine

## 2025-01-25 DIAGNOSIS — Z1231 Encounter for screening mammogram for malignant neoplasm of breast: Secondary | ICD-10-CM

## 2025-01-29 ENCOUNTER — Ambulatory Visit

## 2025-02-07 ENCOUNTER — Ambulatory Visit
